# Patient Record
Sex: Female | Born: 1970 | Race: White | Hispanic: No | Marital: Married | Smoking: Never smoker
Health system: Southern US, Community
[De-identification: ages and names within clinical notes are randomized; demographics above are authoritative.]

## PROBLEM LIST (undated history)

## (undated) DIAGNOSIS — F329 Major depressive disorder, single episode, unspecified: Secondary | ICD-10-CM

## (undated) DIAGNOSIS — N83209 Unspecified ovarian cyst, unspecified side: Secondary | ICD-10-CM

## (undated) DIAGNOSIS — E0500 Thyrotoxicosis with diffuse goiter without thyrotoxic crisis or storm: Secondary | ICD-10-CM

## (undated) HISTORY — PX: TUBAL LIGATION: SHX77

## (undated) HISTORY — PX: CHOLECYSTECTOMY: SHX55

## (undated) HISTORY — DX: Major depressive disorder, single episode, unspecified: F32.9

## (undated) HISTORY — DX: Thyrotoxicosis with diffuse goiter without thyrotoxic crisis or storm: E05.00

## (undated) HISTORY — DX: Unspecified ovarian cyst, unspecified side: N83.209

## (undated) HISTORY — PX: INGUINAL HERNIA REPAIR: SUR1180

---

## 1996-08-18 HISTORY — PX: FEMUR IM ROD REMOVAL: SHX1595

## 1998-08-18 DIAGNOSIS — F32A Depression, unspecified: Secondary | ICD-10-CM

## 1998-08-18 HISTORY — DX: Depression, unspecified: F32.A

## 2006-01-18 ENCOUNTER — Emergency Department (HOSPITAL_COMMUNITY): Admission: EM | Admit: 2006-01-18 | Discharge: 2006-01-18 | Payer: Self-pay | Admitting: Emergency Medicine

## 2006-05-25 ENCOUNTER — Ambulatory Visit (HOSPITAL_COMMUNITY): Admission: RE | Admit: 2006-05-25 | Discharge: 2006-05-25 | Payer: Self-pay | Admitting: Emergency Medicine

## 2007-05-11 ENCOUNTER — Ambulatory Visit: Payer: Self-pay | Admitting: Internal Medicine

## 2007-05-11 DIAGNOSIS — E0500 Thyrotoxicosis with diffuse goiter without thyrotoxic crisis or storm: Secondary | ICD-10-CM | POA: Insufficient documentation

## 2007-05-11 DIAGNOSIS — F329 Major depressive disorder, single episode, unspecified: Secondary | ICD-10-CM | POA: Insufficient documentation

## 2007-05-13 LAB — CONVERTED CEMR LAB
Basophils Absolute: 0.1 10*3/uL (ref 0.0–0.1)
Basophils Relative: 2.5 % — ABNORMAL HIGH (ref 0.0–1.0)
CO2: 31 meq/L (ref 19–32)
Chloride: 106 meq/L (ref 96–112)
Creatinine, Ser: 0.9 mg/dL (ref 0.4–1.2)
Eosinophils Absolute: 0.1 10*3/uL (ref 0.0–0.6)
Eosinophils Relative: 2.6 % (ref 0.0–5.0)
GFR calc Af Amer: 91 mL/min
GFR calc non Af Amer: 75 mL/min
HCT: 32.7 % — ABNORMAL LOW (ref 36.0–46.0)
Monocytes Absolute: 0.3 10*3/uL (ref 0.2–0.7)
Monocytes Relative: 6.7 % (ref 3.0–11.0)
Neutro Abs: 3.2 10*3/uL (ref 1.4–7.7)
Neutrophils Relative %: 59.2 % (ref 43.0–77.0)
RBC: 3.66 M/uL — ABNORMAL LOW (ref 3.87–5.11)
RDW: 14.2 % (ref 11.5–14.6)
Sodium: 141 meq/L (ref 135–145)
TSH: 0.58 microintl units/mL (ref 0.35–5.50)

## 2007-05-25 LAB — CONVERTED CEMR LAB
HCV Ab: NEGATIVE
Hep B C IgM: NEGATIVE
Hepatitis B Surface Ag: NEGATIVE

## 2007-08-03 IMAGING — CR DG FOOT COMPLETE 3+V*R*
3 series · 3 of 3 positions shown · non-contrast
Comparison: none

CLINICAL DATA: Proximal, dorsal right foot pain and swelling after a book
dropped on her foot.

RIGHT FOOT - 3 VIEW

[t foot ap right]
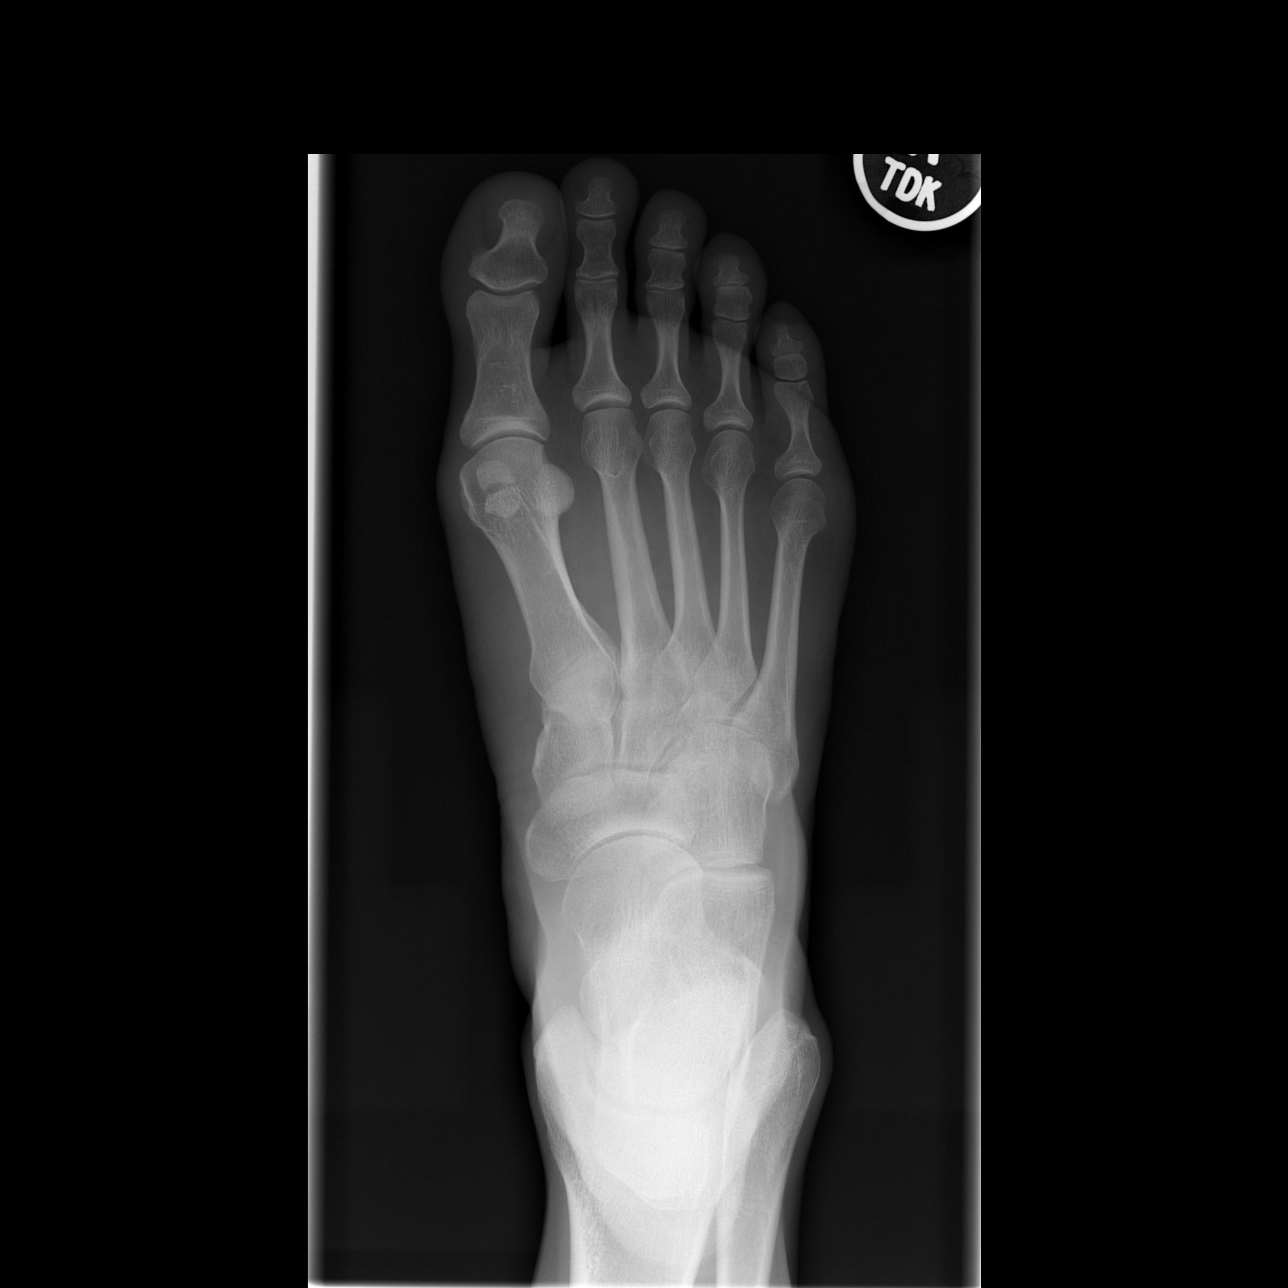

[t foot oblique right]
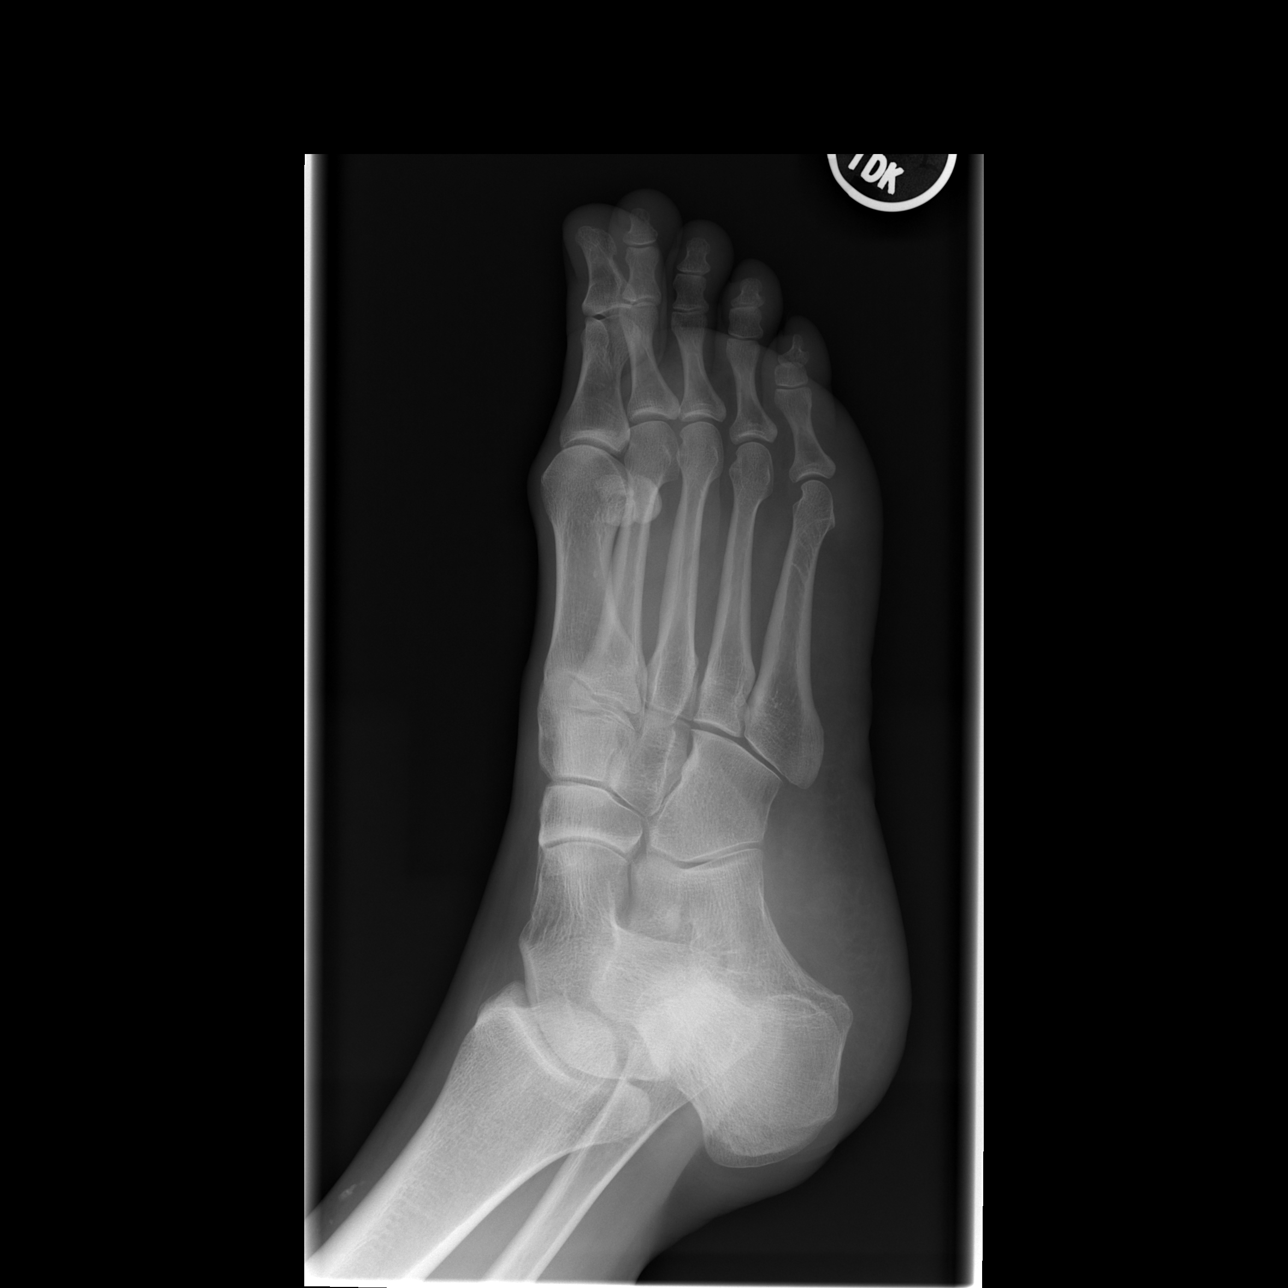

[t foot lat right]
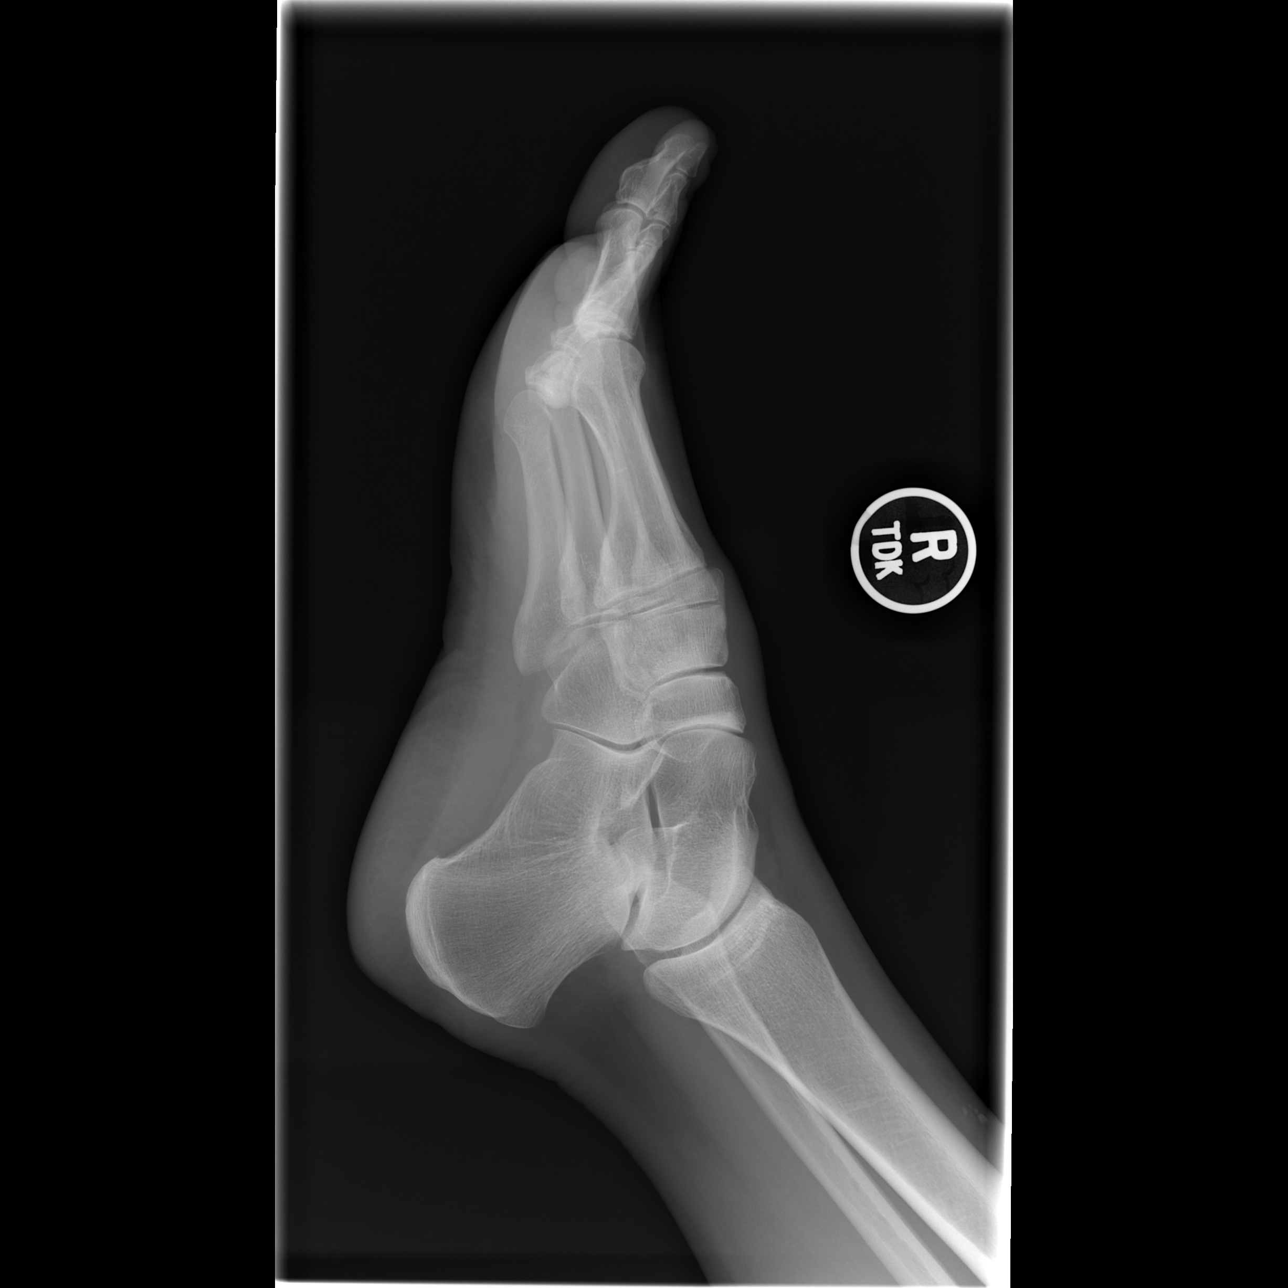

[3 of 3 positions shown; findings below may reference images not displayed]

FINDINGS: Mild proximal, dorsal soft tissue swelling. No fracture or
dislocation. Bipartite first sesamoid bone, medially.

IMPRESSION

No fracture.

## 2007-11-30 ENCOUNTER — Ambulatory Visit: Payer: Self-pay | Admitting: Family Medicine

## 2007-11-30 DIAGNOSIS — G4700 Insomnia, unspecified: Secondary | ICD-10-CM

## 2007-11-30 DIAGNOSIS — L989 Disorder of the skin and subcutaneous tissue, unspecified: Secondary | ICD-10-CM | POA: Insufficient documentation

## 2007-12-02 ENCOUNTER — Ambulatory Visit: Payer: Self-pay | Admitting: Family Medicine

## 2007-12-02 ENCOUNTER — Encounter (INDEPENDENT_AMBULATORY_CARE_PROVIDER_SITE_OTHER): Payer: Self-pay | Admitting: Internal Medicine

## 2008-04-11 ENCOUNTER — Telehealth: Payer: Self-pay | Admitting: Internal Medicine

## 2008-04-11 ENCOUNTER — Telehealth (INDEPENDENT_AMBULATORY_CARE_PROVIDER_SITE_OTHER): Payer: Self-pay | Admitting: *Deleted

## 2008-04-25 ENCOUNTER — Encounter (INDEPENDENT_AMBULATORY_CARE_PROVIDER_SITE_OTHER): Payer: Self-pay | Admitting: Internal Medicine

## 2008-04-25 ENCOUNTER — Other Ambulatory Visit: Admission: RE | Admit: 2008-04-25 | Discharge: 2008-04-25 | Payer: Self-pay | Admitting: Family Medicine

## 2008-04-25 ENCOUNTER — Ambulatory Visit: Payer: Self-pay | Admitting: Family Medicine

## 2008-04-25 DIAGNOSIS — K644 Residual hemorrhoidal skin tags: Secondary | ICD-10-CM | POA: Insufficient documentation

## 2008-04-25 DIAGNOSIS — D492 Neoplasm of unspecified behavior of bone, soft tissue, and skin: Secondary | ICD-10-CM | POA: Insufficient documentation

## 2008-04-25 LAB — CONVERTED CEMR LAB: Pap Smear: NORMAL

## 2008-04-27 ENCOUNTER — Encounter (INDEPENDENT_AMBULATORY_CARE_PROVIDER_SITE_OTHER): Payer: Self-pay | Admitting: *Deleted

## 2008-04-27 ENCOUNTER — Encounter (INDEPENDENT_AMBULATORY_CARE_PROVIDER_SITE_OTHER): Payer: Self-pay | Admitting: Internal Medicine

## 2008-04-28 LAB — CONVERTED CEMR LAB
ALT: 19 units/L (ref 0–35)
AST: 21 units/L (ref 0–37)
Basophils Absolute: 0 10*3/uL (ref 0.0–0.1)
Basophils Relative: 0 % (ref 0.0–3.0)
CO2: 28 meq/L (ref 19–32)
Cholesterol: 114 mg/dL (ref 0–200)
Creatinine, Ser: 0.8 mg/dL (ref 0.4–1.2)
Eosinophils Relative: 2.7 % (ref 0.0–5.0)
Glucose, Bld: 77 mg/dL (ref 70–99)
Hemoglobin: 11.4 g/dL — ABNORMAL LOW (ref 12.0–15.0)
Lymphocytes Relative: 39.1 % (ref 12.0–46.0)
MCV: 91.6 fL (ref 78.0–100.0)
Monocytes Relative: 6.3 % (ref 3.0–12.0)
Neutro Abs: 2.1 10*3/uL (ref 1.4–7.7)
Neutrophils Relative %: 51.9 % (ref 43.0–77.0)
Potassium: 3.9 meq/L (ref 3.5–5.1)
RBC: 3.66 M/uL — ABNORMAL LOW (ref 3.87–5.11)
Total Protein: 6.9 g/dL (ref 6.0–8.3)

## 2008-07-27 ENCOUNTER — Ambulatory Visit: Payer: Self-pay | Admitting: Family Medicine

## 2008-07-27 DIAGNOSIS — D649 Anemia, unspecified: Secondary | ICD-10-CM

## 2008-07-27 DIAGNOSIS — H6690 Otitis media, unspecified, unspecified ear: Secondary | ICD-10-CM | POA: Insufficient documentation

## 2008-08-07 ENCOUNTER — Telehealth (INDEPENDENT_AMBULATORY_CARE_PROVIDER_SITE_OTHER): Payer: Self-pay | Admitting: Internal Medicine

## 2008-08-09 LAB — CONVERTED CEMR LAB
Basophils Absolute: 0 10*3/uL (ref 0.0–0.1)
Eosinophils Absolute: 0.1 10*3/uL (ref 0.0–0.7)
HCT: 31.5 % — ABNORMAL LOW (ref 36.0–46.0)
Hemoglobin: 11.2 g/dL — ABNORMAL LOW (ref 12.0–15.0)
MCV: 93.4 fL (ref 78.0–100.0)
Monocytes Relative: 7.5 % (ref 3.0–12.0)
Neutrophils Relative %: 53 % (ref 43.0–77.0)
Platelets: 145 10*3/uL — ABNORMAL LOW (ref 150–400)

## 2008-08-25 ENCOUNTER — Telehealth (INDEPENDENT_AMBULATORY_CARE_PROVIDER_SITE_OTHER): Payer: Self-pay | Admitting: Internal Medicine

## 2009-07-24 ENCOUNTER — Telehealth (INDEPENDENT_AMBULATORY_CARE_PROVIDER_SITE_OTHER): Payer: Self-pay | Admitting: Internal Medicine

## 2009-08-21 ENCOUNTER — Ambulatory Visit: Payer: Self-pay | Admitting: Family Medicine

## 2009-08-21 DIAGNOSIS — R42 Dizziness and giddiness: Secondary | ICD-10-CM

## 2009-10-25 ENCOUNTER — Telehealth: Payer: Self-pay | Admitting: Internal Medicine

## 2009-12-19 ENCOUNTER — Encounter (INDEPENDENT_AMBULATORY_CARE_PROVIDER_SITE_OTHER): Payer: Self-pay | Admitting: *Deleted

## 2009-12-19 ENCOUNTER — Ambulatory Visit: Payer: Self-pay | Admitting: Internal Medicine

## 2009-12-19 ENCOUNTER — Ambulatory Visit: Payer: Self-pay | Admitting: Family Medicine

## 2009-12-19 DIAGNOSIS — K921 Melena: Secondary | ICD-10-CM

## 2009-12-19 DIAGNOSIS — N393 Stress incontinence (female) (male): Secondary | ICD-10-CM | POA: Insufficient documentation

## 2009-12-20 LAB — CONVERTED CEMR LAB
ALT: 22 units/L (ref 0–35)
AST: 23 units/L (ref 0–37)
BUN: 13 mg/dL (ref 6–23)
Bilirubin, Direct: 0.1 mg/dL (ref 0.0–0.3)
CO2: 31 meq/L (ref 19–32)
Calcium: 9.6 mg/dL (ref 8.4–10.5)
Chloride: 106 meq/L (ref 96–112)
Eosinophils Absolute: 0.2 10*3/uL (ref 0.0–0.7)
Eosinophils Relative: 3.5 % (ref 0.0–5.0)
Glucose, Bld: 85 mg/dL (ref 70–99)
Hemoglobin: 13.4 g/dL (ref 12.0–15.0)
Lymphs Abs: 1.6 10*3/uL (ref 0.7–4.0)
Monocytes Absolute: 0.3 10*3/uL (ref 0.1–1.0)
Neutro Abs: 2.6 10*3/uL (ref 1.4–7.7)
Potassium: 4 meq/L (ref 3.5–5.1)
Sodium: 142 meq/L (ref 135–145)
Total Bilirubin: 0.7 mg/dL (ref 0.3–1.2)
Total Protein: 7.1 g/dL (ref 6.0–8.3)
Triglycerides: 53 mg/dL (ref 0.0–149.0)
VLDL: 10.6 mg/dL (ref 0.0–40.0)
WBC: 4.7 10*3/uL (ref 4.5–10.5)

## 2009-12-27 ENCOUNTER — Encounter: Payer: Self-pay | Admitting: Family Medicine

## 2010-01-04 ENCOUNTER — Ambulatory Visit: Payer: Self-pay | Admitting: Internal Medicine

## 2010-01-07 ENCOUNTER — Encounter: Payer: Self-pay | Admitting: Internal Medicine

## 2010-01-21 ENCOUNTER — Telehealth: Payer: Self-pay | Admitting: Family Medicine

## 2010-02-25 ENCOUNTER — Telehealth: Payer: Self-pay | Admitting: Family Medicine

## 2010-09-17 NOTE — Progress Notes (Signed)
Summary: Immunization records request  Phone Note Call from Patient   Caller: Patient Summary of Call: Patient called requesting her vaccination records.  I do not see them in the chart.  Can you please check to see if we have them? Thanks, Windell Moulding MD  February 25, 2010 8:46 AM  Spoke with patient and told her what records I was able to find from [facility] and she said that she has those same records, and didn't need them.    Initial call taken by: Linde Gillis CMA [facility]),  March 15, 2010 10:12 AM

## 2010-09-17 NOTE — Progress Notes (Signed)
Summary: Neurology Referral for Vertigo- Cancelled per pt  Phone Note Outgoing Call   Summary of Call: FYI to Dr. Alphonsus Sias Neurology Referral Cancelled.   Pt was scheduled to see a Neurologist for Vertigo- says she cancelled the appt, and did not want rescheduled. Says she is better. Order completed.Daine Gip  October 25, 2009 8:52 AM    Follow-up for Phone Call        noted Follow-up by: Cindee Salt MD,  October 25, 2009 3:54 PM

## 2010-09-17 NOTE — Letter (Signed)
Summary: Patient Notice- Colon Biospy Results  [facility]  [address] [place], [place] [address]   Phone: [phone]  Fax: [phone]        Jan 07, 2010 MRN: [id number]    [name] [address] [place], [place]  [address]    Dear Ms. Raburn,  I am pleased to inform you that the biopsies taken during your recent colonoscopy did not show any evidence of colitis or proctitis, there were mild prolapse changes which are nonspecific..  Additional information/recommendations:  _x_No further action is needed at this time.  Please follow-up with      your primary care physician for your other healthcare needs.  __Please call [phone] to schedule a return visit to review      your condition.  _x_Continue with the treatment plan as outlined on the day of your      exam.You may use the suppositories intermittently for rectal bleeding.  _x_You should have a repeat colonoscopy examination for this problem           in 11_ years at age 52  Please call us if you are having persistent problems or have questions about your condition that have not been fully answered at this time.  Sincerely,  Hart Carwin MD   This letter has been electronically signed by your physician.  Appended Document: Patient Notice- Colon Biospy Results letter mailed

## 2010-09-17 NOTE — Letter (Signed)
Summary: [facility] Instructions  [facility]  [address] [place], [place] [address]   Phone: [phone]  Fax: [phone]       Evelyn Alexander    July 24, 1971    MRN: [id number]       Procedure Day Dorna Bloom:  Farrell Ours  01/04/10     Arrival Time:  10:30AM     Procedure Time:  11:30AM     Location of Procedure:                    _X _  [facility] (4th Floor)    PREPARATION FOR COLONOSCOPY WITH MIRALAX  Starting 5 days prior to your procedure 12/30/09 do not eat nuts, seeds, popcorn, corn, beans, peas,  salads, or any raw vegetables.  Do not take any fiber supplements (e.g. Metamucil, Citrucel, and Benefiber). ____________________________________________________________________________________________________   THE DAY BEFORE YOUR PROCEDURE         DATE: 01/03/10  DAY: THURSDAY  1   Drink clear liquids the entire day-NO SOLID FOOD  2   Do not drink anything colored red or purple.  Avoid juices with pulp.  No orange juice.  3   Drink at least 64 oz. (8 glasses) of fluid/clear liquids during the day to prevent dehydration and help the prep work efficiently.  CLEAR LIQUIDS INCLUDE: Water Jello Ice Popsicles Tea (sugar ok, no milk/cream) Powdered fruit flavored drinks Coffee (sugar ok, no milk/cream) Gatorade Juice: apple, white grape, white cranberry  Lemonade Clear bullion, consomm, broth Carbonated beverages (any kind) Strained chicken noodle soup Hard Candy  4   Mix the entire bottle of Miralax with 64 oz. of Gatorade/Powerade in the morning and put in the refrigerator to chill.  5   At 3:00 pm take 2 Dulcolax/Bisacodyl tablets.  6   At 4:30 pm take one Reglan/Metoclopramide tablet.  7  Starting at 5:00 pm drink one 8 oz glass of the Miralax mixture every 15-20 minutes until you have finished drinking the entire 64 oz.  You should finish drinking prep around 7:30 or 8:00 pm.  8   If you are nauseated, you may take the 2nd  Reglan/Metoclopramide tablet at 6:30 pm.        9    At 8:00 pm take 2 more DULCOLAX/Bisacodyl tablets.     THE DAY OF YOUR PROCEDURE      DATE:  01/04/10   DAY: Farrell Ours  You may drink clear liquids until 9:30AM  (2 HOURS BEFORE PROCEDURE).   MEDICATION INSTRUCTIONS  Unless otherwise instructed, you should take regular prescription medications with a small sip of water as early as possible the morning of your procedure.         OTHER INSTRUCTIONS  You will need a responsible adult at least 41 years of age to accompany you and drive you home.   This person must remain in the waiting room during your procedure.  Wear loose fitting clothing that is easily removed.  Leave jewelry and other valuables at home.  However, you may wish to bring a book to read or an iPod/MP3 player to listen to music as you wait for your procedure to start.  Remove all body piercing jewelry and leave at home.  Total time from sign-in until discharge is approximately 2-3 hours.  You should go home directly after your procedure and rest.  You can resume normal activities the day after your procedure.  The day of your procedure you should not:  Drive   Make legal decisions   Operate machinery   Drink alcohol   Return to work  You will receive specific instructions about eating, activities and medications before you leave.   The above instructions have been reviewed and explained to me by   Wyona Almas RN  Dec 19, 2009 2:00 PM     I fully understand and can verbalize these instructions _____________________________ Date _______

## 2010-09-17 NOTE — Assessment & Plan Note (Signed)
Summary: vertigo   Vital Signs:  Patient profile:   40 year old female Height:      64 inches Weight:      136.25 pounds BMI:     23.47 Temp:     98.8 degrees F oral Pulse rate:   56 / minute Pulse rhythm:   regular BP sitting:   100 / 68  (left arm) Cuff size:   regular  Vitals Entered By: Lewanda Rife LPN (August 21, 2009 10:56 AM)  CC:  Vertigo one time on 08/18/09 while on treadmill. No other episode.  History of Present Illness: Here for dizziness--onset 08/18/2009 became presyncipal running on treadmill, got off, lasted  ~45-60 sec, felt that room was spinning, able to walk holding on after few sec of sitting on treadmill, resolved completedly and has not returned --has not run again --had lots of coffee prior to run, not much to eat that day  Hx of Bells palsey remotely, has been tender in L cheek distribution of 7th cranial nerve which lasted 24 hrs some mo ago.  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  Allergies (verified): No Known Drug Allergies  Past History:  Past Medical History: Reviewed history from 05/11/2007 and no changes required. Graves disease--methimazole and PTU Ovarian cysts Depression--2000  Past Surgical History: Reviewed history from 05/11/2007 and no changes required. Left femur intramedullary rod--benign tumor  1998 Cholecystectomy--2005 Tubal ligation with chole Bilateral inguinal hernia--baby  Review of Systems CV:  Denies chest pain or discomfort and palpitations. Resp:  Denies cough, shortness of breath, and wheezing. GI:  Denies nausea and vomiting. MS:  Complains of joint pain and muscle aches. Neuro:  See HPI; Complains of disturbances in coordination and poor balance; denies brief paralysis, falling down, inability to speak, memory loss, seizures, and  weakness. Psych:  Complains of anxiety; denies depression.  Physical Exam  General:  alert, well-developed, well-nourished, and well-hydrated.  NAD Ears:  R ear normal and L ear normal.   Nose:  no mucosal edema and no airflow obstruction.   Mouth:  no exudates and pharyngeal erythema.   Neck:  no thyromegaly, no JVD, and no carotid bruits.   Lungs:  normal respiratory effort, no intercostal retractions, no accessory muscle use, and normal breath sounds.   Heart:  normal rate, regular rhythm, and no murmur.   Extremities:  no edema either lower leg Neurologic:  alert & oriented X3, cranial nerves II-XII intact, strength normal in all extremities, sensation intact to light touch, gait normal, DTRs symmetrical and normal, finger-to-nose normal, and Romberg negative--no palmer drift, able to heel-toe-tandum walk without difficulty.   Skin:  turgor normal and color normal.   Psych:  normally interactive and good eye contact.     Impression & Recommendations:  Problem # 1:  VERTIGO (ICD-780.4) Assessment New due to unusual presentation and impending trip out of the country for extended time, will refer to neurologist for eval see back if reoccurs before appt or to [place]--agrees Orders: Neurology Referral (Neuro)   In exam room  Complete Medication List: 1)  Celexa 40 Mg Tabs (Citalopram hydrobromide) .... Take 1 tablet by mouth once a day 2)  Multivitamins Tabs (Multiple vitamin) .... Take 1 tablet by mouth once a day 3)  Anusol-hc 25 Mg Supp (Hydrocortisone acetate) .Marland Kitchen.. 1 pu  to three times a day as needed discomfort 4)  Melatonin 5 Mg Caps (Melatonin) .... One at bedtime  Patient Instructions: 1)  refer to neurology  Current Allergies (reviewed today): No known allergies

## 2010-09-17 NOTE — Miscellaneous (Signed)
Summary: [facility] Previsit/prep  Clinical Lists Changes  Medications: Added new medication of DULCOLAX 5 MG  TBEC (BISACODYL) Day before procedure take 2 at 3pm and 2 at 8pm. - Signed Added new medication of METOCLOPRAMIDE HCL 10 MG  TABS (METOCLOPRAMIDE HCL) As per prep instructions. - Signed Rx of DULCOLAX 5 MG  TBEC (BISACODYL) Day before procedure take 2 at 3pm and 2 at 8pm.;  #4 x 0;  Signed;  Entered by: Wyona Almas RN;  Authorized by: Hart Carwin MD;  Method used: [facility]  [place] [place], [place]  [address], Ph: [phone] or [phone], Fax: [phone] Rx of METOCLOPRAMIDE HCL 10 MG  TABS (METOCLOPRAMIDE HCL) As per prep instructions.;  #2 x 0;  Signed;  Entered by: Wyona Almas RN;  Authorized by: Hart Carwin MD;  Method used: [facility]  [place], [place], [place]  [address], Ph: [phone] or [phone], Fax: [phone] Observations: Added new observation of NKA: T (12/19/2009 13:36)    Prescriptions: METOCLOPRAMIDE HCL 10 MG  TABS (METOCLOPRAMIDE HCL) As per prep instructions.  #2 x 0   Entered by:   Wyona Almas RN   Authorized by:   Hart Carwin MD   Signed by:   Wyona Almas RN on 12/19/2009   Method used:   [facility]ronically to        [facility]  [place] (retail)       [address]       [place], [place]  [address]       Ph: [phone] or [phone]       Fax: [phone]   RxID:   [id number] DULCOLAX 5 MG  TBEC (BISACODYL) Day before procedure take 2 at 3pm and 2 at 8pm.  #4 x 0   Entered by:   Wyona Almas RN   Authorized by:   Hart Carwin MD   Signed by:   Wyona Almas RN on 12/19/2009   Method used:   Electronically to        [facility]  [place] (retail)       [address]       [place], [place]  [address]       Ph: [phone] or [phone]       Fax: [phone]   RxID:   [id number]

## 2010-09-17 NOTE — Miscellaneous (Signed)
Summary: [facility]  Clinical Lists Changes  Medications: Added new medication of CANASA 1000 MG SUPP (MESALAMINE) insert 1 at bedtime for 2 weeks then 2 times/ week - Signed Rx of CANASA 1000 MG SUPP (MESALAMINE) insert 1 at bedtime for 2 weeks then 2 times/ week;  #30 x 1;  Signed;  Entered by: Jennye Boroughs RN;  Authorized by: Hart Carwin MD;  Method used: [facility]  [place], [place], [place]  [address], Ph: [phone] or [phone], Fax: [phone]    Prescriptions: CANASA 1000 MG SUPP (MESALAMINE) insert 1 at bedtime for 2 weeks then 2 times/ week  #30 x 1   Entered by:   Jennye Boroughs RN   Authorized by:   Hart Carwin MD   Signed by:   Jennye Boroughs RN on 01/04/2010   Method used:   Electronically to        [facility]  [place] (retail)       [address]       [place], [place]  [address]       Ph: [phone] or [phone]       Fax: [phone]   RxID:   [id number]

## 2010-09-17 NOTE — Progress Notes (Signed)
Summary: Rx Ambien  Phone Note Call from Patient   Caller: Patient Summary of Call: Received a call from pt stating that she tried to refill her Remus Loffler in [place] state but pharmacy will not accept the written script because it is out of state.  [place]aving for [place] in a few days.  Asks for [facility] to be [facility] into [facility] at [phone].  Please call in as written below.  Thanks! Initial call taken by: Ruthe Mannan MD,  January 21, 2010 9:43 AM  Follow-up for Phone Call        Tried calling in Rx to number given, number is disconnected or no longer in service.  Left a message on cell phone voicemail for patient to call back with a correct phone number to the pharmacy in [place].  Linde Gillis CMA [facility])  January 21, 2010 10:06 AM   Patient called back and gave correct phone number [phone], Rx was called in to pharmacy. Follow-up by: Linde Gillis CMA [facility]),  January 21, 2010 10:13 AM    Prescriptions: AMBIEN CR 6.25 MG  TBCR (ZOLPIDEM TARTRATE) 1 by mouth at bedtimeas needed  #30 x 6   Entered and Authorized by:   Ruthe Mannan MD   Signed by:   Ruthe Mannan MD on 01/21/2010   Method used:   Telephoned to ...       [facility]  [place] (retail)       [address]       [place], [place]  [address]       Ph: [phone] or [phone]       Fax: [phone]   RxID:   [id number]   Appended Document: Rx Ambien Received a call from the pharmacist in [place], [place] stating that [place] laws are different than [place] laws.  [place] law only allows a 5 day Rx for such medications like Ambien to be called in over the telephone.  Dr Dayton Martes, please write a Rx for a 5 day supply of Ambien with no refills and please write a Rx for a 30 day supply with refills.  These Rx's must be mailed to [address] [place] [address].  Please advise.

## 2010-09-17 NOTE — Assessment & Plan Note (Signed)
Summary: CPX/[facility]'S PT/CLE   Vital Signs:  Patient profile:   40 year old female Height:      64 inches Weight:      137.25 pounds BMI:     23.64 Temp:     98.7 degrees F oral Pulse rate:   80 / minute Pulse rhythm:   regular BP sitting:   88 / 50  (left arm) Cuff size:   regular  Vitals Entered By: Delilah Shan CMA [facility]) (Dec 19, 2009 8:42 AM) CC: CPX   History of Present Illness: Very pleasant 40 yo here for CPX.  Leaving for [place] in a few weeks, will be there for at least a year.  Stress urinary incontinence- has been ongoing issue for years, getting progressively worse. Occurs most frequently with exercise.  Has tried Kegel exercises, no improvement.  Changes in her bowel habits- has had hemorrhoids and contipation issue for years.  Most recently, has also developped diarrhea, mucous and blood in her stools with occassional fecal incontinence during exercise. No pain with defecation.  Does often have abdominal cramping.  No family h/o IBD or colon CA.  She does have a h/o Grave's disease currently in remission.  Depression- well controlled on Celexa 40 mg daily.  She is anxious about moving to [place], a lot of planning and things to think about.  No panic attacks but does have difficulty staying asleep at times.  Takes Melatonin 5 mg at bedtime which typically helps but lately is not helping much.  Current Medications (verified): 1)  Celexa 40 Mg  Tabs (Citalopram Hydrobromide) .... Take 1 Tablet By Mouth Once A Day 2)  Anusol-Hc 25 Mg Supp (Hydrocortisone Acetate) .Marland Kitchen.. 1 Pu To Three Times A Day As Needed Discomfort 3)  Melatonin 5 Mg Caps (Melatonin) .... One At Bedtime 4)  Alprazolam 0.25 Mg Tabs (Alprazolam) .Marland Kitchen.. 1 Tab By Mouth Three Times A Day As Needed Anxiety 5)  Detrol 1 Mg Tabs (Tolterodine Tartrate) .Marland Kitchen.. 1 Tab By Mouth Bid  Allergies (verified): No Known Drug Allergies  Past History:  Past Medical History: Last updated: 06-01-2007 Graves  disease--methimazole and PTU Ovarian cysts Depression--2000  Past Surgical History: Last updated: Jun 01, 2007 Left femur intramedullary rod--benign tumor  1998 Cholecystectomy--2005 Tubal ligation with chole Bilateral inguinal hernia--baby  Family History: Last updated: 06-01-2007 Dad died gliobastoma @48  Mom is bipolar, personaity disorder 2 brothers 2 adopted sibs No CAD, HTN, DM No breast or colon cancer No other depression or bipolar  Social History: Last updated: 07/27/2008 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 Eyes:  Denies blurring. ENT:  Denies difficulty swallowing. CV:  Denies chest pain or discomfort and difficulty breathing at night. Resp:  Denies shortness of breath. GI:  Complains of bloody stools, change in bowel habits, constipation, diarrhea, and hemorrhoids; denies abdominal pain, nausea, and vomiting. GU:  Complains of incontinence, urinary frequency, and urinary hesitancy; denies dysuria. MS:  Denies joint pain, joint redness, and joint swelling. Derm:  Denies rash. Psych:  Complains of anxiety; denies depression.  Physical Exam  General:  alert, well-developed, well-nourished, and well-hydrated.  NAD Head:  normocephalic, atraumatic, and no abnormalities observed.   Eyes:  pupils equal, pupils round, pupils reactive to light, and no optic disk abnormalities.   Ears:  R ear normal and L  ear normal.   Mouth:  Oral mucosa and oropharynx without lesions or exudates.  Teeth in good repair. Lungs:  normal respiratory effort, no intercostal retractions, no accessory muscle use, and normal breath sounds.   Heart:  normal rate, regular rhythm, and no murmur.   Extremities:  no edema either lower leg Skin:  turgor normal and color normal.   Psych:   normally interactive and good eye contact.     Impression & Recommendations:  Problem # 1:  Preventive Health Care (ICD-V70.0) Reviewed preventive care protocols, scheduled due services, and updated immunizations Discussed nutrition, exercise, diet, and healthy lifestyle. FLP, BMET, CBC today (h/o anemia). Orders: TLB-BMP (Basic Metabolic Panel-BMET) (80048-METABOL)  Problem # 2:  BLOOD IN STOOL (ICD-578.1) Assessment: New Did not perform rectal exam as she we were able to get her appt with GI today.  ?possible IBD, will likely need colonoscopy before she leaves for [place]. 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  UA not performed as this has been going on for years, unlikely infectious process.  Given Detrol although I explained side effects (worsening constipation).  Will most likely benefit from surgical intervention or pessary. Orders: Urology Referral (Urology)  Complete Medication List: 1)  Celexa 40 Mg Tabs (Citalopram hydrobromide) .... Take 1 tablet by mouth once a day 2)  Anusol-hc 25 Mg Supp (Hydrocortisone acetate) .Marland Kitchen.. 1 pu to three times a day as needed discomfort 3)  Melatonin 5 Mg Caps (Melatonin) .... One at bedtime 4)  Alprazolam 0.25 Mg Tabs (Alprazolam) .Marland Kitchen.. 1 tab by mouth three times a day as needed anxiety 5)  Detrol 1 Mg Tabs (Tolterodine tartrate) .Marland Kitchen.. 1 tab by mouth bid  Other Orders: Venipuncture ([address]) TLB-Lipid Panel (80061-LIPID) TLB-Hepatic/Liver Function Pnl (80076-HEPATIC) TLB-CBC Platelet - w/Differential (85025-CBCD) Tdap => 31yrs IM (91478) Admin 1st Vaccine ([address])  Patient Instructions: 1)  Great to see you!!! 2)  I will see you at your party soon. 3)  Please stop by and see Shirlee Limerick on your way out. Prescriptions: CELEXA 40 MG  TABS (CITALOPRAM HYDROBROMIDE) Take 1 tablet by mouth once a day  #90 x 12   Entered and  Authorized by:   Ruthe Mannan MD   Signed by:   Ruthe Mannan MD on 12/19/2009   Method used:   Print then Give to Patient   RxID:   [id number] DETROL 1 MG TABS (TOLTERODINE TARTRATE) 1 tab by mouth bid  #60 x 0   Entered and Authorized by:   Ruthe Mannan MD   Signed by:   Ruthe Mannan MD on 12/19/2009   Method used:   Print then Give to Patient   RxID:   [id number] ALPRAZOLAM 0.25 MG TABS (ALPRAZOLAM) 1 tab by mouth three times a day as needed anxiety  #60 x 0   Entered and Authorized by:   Ruthe Mannan MD   Signed by:   Ruthe Mannan MD on 12/19/2009   Method used:   Print then Give to Patient   RxID:   [id number]   Current Allergies (reviewed today): No known allergies    Immunizations Administered:  Tetanus Vaccine:    Vaccine Type: Tdap    Site: left deltoid    Mfr: GlaxoSmithKline    Dose: 0.5 ml    Route: IM    Given by: Delilah Shan CMA (AAMA)    Exp. Date: 01/10/2012    Lot #: [id number]  VIS given: 07/06/07 version given Dec 19, 2009.

## 2010-09-17 NOTE — Letter (Signed)
Summary: Records Dated 02-14-09 thru 09-28-09/[facility]  Records Dated 02-14-09 thru 09-28-09/[facility]   Imported By: Lanelle Bal 01/08/2010 10:40:56  _____________________________________________________________________  External Attachment:    Type:   Image     Comment:   External Document

## 2010-09-17 NOTE — Consult Note (Signed)
Summary: [facility]  [facility]   Imported By: Lanelle Bal 01/02/2010 12:41:00  _____________________________________________________________________  External Attachment:    Type:   Image     Comment:   External Document

## 2010-09-17 NOTE — Procedures (Signed)
Summary: Colonoscopy  Patient: Evelyn Alexander Note: All result statuses are Final unless otherwise noted.  Tests: (1) Colonoscopy (COL)   COL Colonoscopy           DONE     [facility]     [address]     [place], [place]  [address]           COLONOSCOPY PROCEDURE REPORT           PATIENT:  Evelyn Alexander, Evelyn Alexander  MR#:  [id number]     BIRTHDATE:  1971-01-25, 38 yrs. old  GENDER:  female     ENDOSCOPIST:  Hedwig Morton. Juanda Chance, MD     REF. BY:  Ruthe Mannan, M.D.     PROCEDURE DATE:  01/04/2010     PROCEDURE:  Colonoscopy 19147     ASA CLASS:  Class I     INDICATIONS:  hematochezia pt is a runner, painless hematochezia     and mucous x 1 year     MEDICATIONS:   Versed 10 mg, Fentanyl 100 mcg, Benadryl 25 mg           DESCRIPTION OF PROCEDURE:   After the risks benefits and     alternatives of the procedure were thoroughly explained, informed     consent was obtained.  Digital rectal exam was performed and     revealed no rectal masses.   The LB [id number] endoscope     was introduced through the anus and advanced to the cecum, which     was identified by both the appendix and ileocecal valve, without     limitations.  The quality of the prep was good, using MiraLax.     The instrument was then slowly withdrawn as the colon was fully     examined.     <<PROCEDUREIMAGES>>           FINDINGS:  Abnormal appearing mucosa. nonspecific erythema and     edema in the rectal ampulla, no friability, there is a loss of     vascular pattern, r/o proctitis With standard forceps, biopsy was     obtained and sent to pathology (see image1, image5, image6, and     image7).  This was otherwise a normal examination of the colon.     r/o microscopic IBD Random biopsies were obtained and sent to     pathology (see image7, image4, image3, and image2).   Retroflexed     views in the rectum revealed no abnormalities.    The scope was     then withdrawn from the patient and the  procedure completed.           COMPLICATIONS:  None     ENDOSCOPIC IMPRESSION:     1) Abnormal mucosa     2) Otherwise normal examination     r/o low grade proctitis, s/p biopsies     RECOMMENDATIONS:     1) Await biopsy results     Canasa supp 1000mg , # 30 insert 1 qhs x 2 weeks, then 2x/week, 1     refill     REPEAT EXAM:  In 0 year(s) for.           ______________________________     Hedwig Morton. Juanda Chance, MD           CC:           n.     eSIGNED:   Hedwig Morton. Shauntay Brunelli at 01/04/2010 12:14 PM  Fredna, Stricker, [id number]  Note: An exclamation mark (!) indicates a result that was not dispersed into the flowsheet. Document Creation Date: 01/04/2010 1:54 PM _______________________________________________________________________  (1) Order result status: Final Collection or observation date-time: 01/04/2010 12:07 Requested date-time:  Receipt date-time:  Reported date-time:  Referring Physician:   Ordering Physician: Lina Sar [phone]) Specimen Source:  Source: Launa Grill Order Number: [id number] Lab site:   Appended Document: Colonoscopy     Procedures Next Due Date:    Colonoscopy: 12/2020

## 2011-02-14 ENCOUNTER — Telehealth: Payer: Self-pay | Admitting: *Deleted

## 2011-02-14 NOTE — Telephone Encounter (Signed)
I returned her call and spoke with her.

## 2011-02-14 NOTE — Telephone Encounter (Signed)
Patient is asking if you could call her. She didn't want to give me any information, was only asking that you call her.

## 2011-02-17 ENCOUNTER — Other Ambulatory Visit: Payer: Self-pay

## 2011-02-26 ENCOUNTER — Encounter: Payer: Self-pay | Admitting: Family Medicine

## 2011-02-26 LAB — HM PAP SMEAR

## 2011-02-26 LAB — HM COLONOSCOPY

## 2011-02-27 ENCOUNTER — Ambulatory Visit (INDEPENDENT_AMBULATORY_CARE_PROVIDER_SITE_OTHER): Payer: Managed Care, Other (non HMO) | Admitting: Family Medicine

## 2011-02-27 ENCOUNTER — Encounter: Payer: Self-pay | Admitting: Family Medicine

## 2011-02-27 ENCOUNTER — Other Ambulatory Visit (HOSPITAL_COMMUNITY)
Admission: RE | Admit: 2011-02-27 | Discharge: 2011-02-27 | Disposition: A | Discharge status: Home / Self Care | Payer: Managed Care, Other (non HMO) | Source: Ambulatory Visit | Attending: Family Medicine | Admitting: Family Medicine

## 2011-02-27 VITALS — BP 100/60 | HR 65 | Temp 98.7°F | Ht 63.75 in | Wt 132.2 lb

## 2011-02-27 DIAGNOSIS — Z0000 Encounter for general adult medical examination without abnormal findings: Secondary | ICD-10-CM

## 2011-02-27 DIAGNOSIS — Z01419 Encounter for gynecological examination (general) (routine) without abnormal findings: Secondary | ICD-10-CM | POA: Insufficient documentation

## 2011-02-27 DIAGNOSIS — E0500 Thyrotoxicosis with diffuse goiter without thyrotoxic crisis or storm: Secondary | ICD-10-CM

## 2011-02-27 MED ORDER — CITALOPRAM HYDROBROMIDE 40 MG PO TABS: ORAL_TABLET | ORAL | Status: DC

## 2011-02-27 MED ORDER — ALPRAZOLAM 0.25 MG PO TABS: 0.2500 mg | ORAL_TABLET | Freq: Three times a day (TID) | ORAL | Status: DC | PRN

## 2011-02-27 MED ORDER — AZELAIC ACID 15 % EX GEL: CUTANEOUS | Status: DC

## 2011-02-27 MED ORDER — ZOLPIDEM TARTRATE ER 6.25 MG PO TBCR: 6.2500 mg | EXTENDED_RELEASE_TABLET | Freq: Every evening | ORAL | Status: DC | PRN

## 2011-02-27 MED ORDER — ATOVAQUONE-PROGUANIL HCL 250-100 MG PO TABS: 1.0000 | ORAL_TABLET | Freq: Every day | ORAL | Status: DC

## 2011-02-27 NOTE — Patient Instructions (Signed)
Great to see you!! Call me tomorrow if you have a free moment to hang out.  I will not be offended if you don't have time. [phone] We love you guys!

## 2011-02-27 NOTE — Progress Notes (Signed)
Very pleasant 40 yo here for CPX.  Home for a few weeks from [place].  Returns to [place] next week. Getting anxious about going back to finish her last year there.  Depression- well controlled on Celexa 40 mg daily.  She is anxious about going back to [place].   No panic attacks but does have difficulty staying asleep at times.  Takes Melatonin 5 mg at bedtime which typically helps, sometimes occasionally needs Ambien.  More fatigued lately with decreased exercise tolerance. No CP, SOB. Does have a h/o Grave's disease.  Denies any other symptoms of hypo or hyperthyroidism.  Patient Active Problem List  Diagnoses  . BONE TUMOR  . GRAVE'S DISEASE  . ANEMIA  . DEPRESSION  . LOM  . HEMORRHOIDS, EXTERNAL  . BLOOD IN STOOL  . URINARY INCONTINENCE, STRESS, FEMALE  . SKIN LESION  . VERTIGO  . INSOMNIA   Past Medical History  Diagnosis Date  . Grave's disease     Methimazole and PTU  . Other and unspecified ovarian cysts   . Depression 2000   Past Surgical History  Procedure Date  . Cholecystectomy   . Tubal ligation     with chole  . Inguinal hernia repair     bilateral  . Femur im rod removal 1998    benign tumor   History  Substance Use Topics  . Smoking status: Never Smoker   . Smokeless tobacco: Not on file  . Alcohol Use: Yes   Family History  Problem Relation Age of Onset  . Bipolar disorder Mother   . Personality disorder Mother    Allergies not on file Current Outpatient Prescriptions on File Prior to Visit  Medication Sig Dispense Refill  . ALPRAZolam (XANAX) 0.25 MG tablet Take 0.25 mg by mouth 3 (three) times daily as needed.        . citalopram (CELEXA) 40 MG tablet Take 40 mg by mouth daily.        Marland Kitchen doxycycline (DORYX) 100 MG DR capsule Take 100 mg by mouth daily.        . hydrocortisone (ANUSOL-HC) 25 MG suppository Place 25 mg rectally 3 (three) times daily as needed.        . Melatonin 5 MG CAPS Take 1 capsule by mouth at bedtime.        .  mesalamine (CANASA) 1000 MG suppository Two times per week       . tolterodine (DETROL) 1 MG tablet Take 1 mg by mouth 2 (two) times daily.        Marland Kitchen zolpidem (AMBIEN CR) 6.25 MG CR tablet Take 6.25 mg by mouth at bedtime as needed.         The PMH, PSH, Social History, Family History, Medications, and allergies have been reviewed in [facility], and have been updated if relevant.  Review of Systems       See HPI General:  Denies chills, fever, and malaise. Eyes:  Denies blurring. ENT:  Denies difficulty swallowing. CV:  Denies chest pain or discomfort and difficulty breathing at night. Resp:  Denies shortness of breath. MS:  Denies joint pain, joint redness, and joint swelling. Derm:  Denies rash. Psych:  Complains of anxiety; denies depression.  Physical Exam BP 100/60  Pulse 65  Temp(Src) 98.7 F (37.1 C) (Oral)  Ht 5' 3.75" (1.619 m)  Wt 132 lb 4 oz (59.988 kg)  BMI 22.88 kg/m2  LMP 02/07/2011  General:  alert, well-developed, well-nourished, and well-hydrated.  NAD Head:  normocephalic, atraumatic, and no abnormalities observed.   Eyes:  pupils equal, pupils round, pupils reactive to light, and no optic disk abnormalities.   Ears:  R ear normal and L ear normal.   Mouth:  Oral mucosa and oropharynx without lesions or exudates.  Teeth in good repair. Lungs:  normal respiratory effort, no intercostal retractions, no accessory muscle use, and normal breath sounds.   Heart:  normal rate, regular rhythm, and no murmur.   Extremities:  no edema either lower leg Skin:  turgor normal and color normal.   Psych:  normally interactive and good eye contact.    Assessment and Plan: 1. Grave's disease  TSH, T4, free  2. Routine general medical examination at a health care facility  Cytology -Pap Smear  Reviewed preventive care protocols, scheduled due services, and updated immunizations Discussed nutrition, exercise, diet, and healthy lifestyle.

## 2011-03-05 ENCOUNTER — Other Ambulatory Visit: Payer: Self-pay | Admitting: Family Medicine

## 2011-03-05 MED ORDER — ATOVAQUONE-PROGUANIL HCL 250-100 MG PO TABS: 1.0000 | ORAL_TABLET | Freq: Every day | ORAL | Status: DC

## 2011-03-10 ENCOUNTER — Telehealth: Payer: Self-pay | Admitting: *Deleted

## 2011-03-10 NOTE — Telephone Encounter (Signed)
Yes I do not plan on using lubricant from this point forward. Evelyn Alexander was advised to have it repeated when she returns from [place].

## 2011-03-10 NOTE — Telephone Encounter (Signed)
Amy called to let Dr. Dayton Martes know that the pap was repeated three times and still there was to much lubricant to successfully analyze pap results, however the HPV test was negative.  She advised against using the lubricant any longer.

## 2012-01-01 ENCOUNTER — Other Ambulatory Visit: Payer: Self-pay | Admitting: Family Medicine

## 2012-01-01 ENCOUNTER — Telehealth: Payer: Self-pay | Admitting: Family Medicine

## 2012-01-01 DIAGNOSIS — J340 Abscess, furuncle and carbuncle of nose: Secondary | ICD-10-CM

## 2012-01-01 MED ORDER — CLINDAMYCIN HCL 300 MG PO CAPS: 300.0000 mg | ORAL_CAPSULE | Freq: Three times a day (TID) | ORAL | Status: AC

## 2012-01-01 NOTE — Telephone Encounter (Signed)
Pt recently returned from 2 year stay in [place], living in [place].  Has what feels like an abscess in her nose, now having pain in ear and face on that side. Pt is a physician.  Afebrile.  Originally sent in rx for doxycycline but symptoms have progressed so we have switched to clindamycin 300 mg three times daily x 10 days.  Needs ASAP referral to ENT. Thank you.  New phone number is [phone]

## 2012-01-14 ENCOUNTER — Encounter: Payer: Self-pay | Admitting: Family Medicine

## 2012-01-14 ENCOUNTER — Other Ambulatory Visit (HOSPITAL_COMMUNITY)
Admission: RE | Admit: 2012-01-14 | Discharge: 2012-01-14 | Disposition: A | Discharge status: Home / Self Care | Payer: Managed Care, Other (non HMO) | Source: Ambulatory Visit | Attending: Family Medicine | Admitting: Family Medicine

## 2012-01-14 ENCOUNTER — Ambulatory Visit (INDEPENDENT_AMBULATORY_CARE_PROVIDER_SITE_OTHER): Payer: Managed Care, Other (non HMO) | Admitting: Family Medicine

## 2012-01-14 VITALS — BP 90/54 | HR 52 | Temp 98.2°F | Wt 135.0 lb

## 2012-01-14 DIAGNOSIS — Z01419 Encounter for gynecological examination (general) (routine) without abnormal findings: Secondary | ICD-10-CM | POA: Insufficient documentation

## 2012-01-14 DIAGNOSIS — R5383 Other fatigue: Secondary | ICD-10-CM

## 2012-01-14 DIAGNOSIS — R5381 Other malaise: Secondary | ICD-10-CM

## 2012-01-14 DIAGNOSIS — D649 Anemia, unspecified: Secondary | ICD-10-CM

## 2012-01-14 DIAGNOSIS — E0500 Thyrotoxicosis with diffuse goiter without thyrotoxic crisis or storm: Secondary | ICD-10-CM

## 2012-01-14 DIAGNOSIS — Z0000 Encounter for general adult medical examination without abnormal findings: Secondary | ICD-10-CM | POA: Insufficient documentation

## 2012-01-14 DIAGNOSIS — Z1231 Encounter for screening mammogram for malignant neoplasm of breast: Secondary | ICD-10-CM

## 2012-01-14 DIAGNOSIS — R1032 Left lower quadrant pain: Secondary | ICD-10-CM

## 2012-01-14 DIAGNOSIS — Z136 Encounter for screening for cardiovascular disorders: Secondary | ICD-10-CM

## 2012-01-14 LAB — LIPID PANEL
Cholesterol: 134 mg/dL (ref 0–200)
HDL: 44.8 mg/dL (ref >39.00)
LDL Cholesterol: 80 mg/dL (ref 0–99)
Total CHOL/HDL Ratio: 3
VLDL: 9.4 mg/dL (ref 0.0–40.0)

## 2012-01-14 LAB — COMPREHENSIVE METABOLIC PANEL
Albumin: 3.5 g/dL (ref 3.5–5.2)
Alkaline Phosphatase: 42 U/L (ref 39–117)
Chloride: 106 mEq/L (ref 96–112)
Creatinine, Ser: 0.8 mg/dL (ref 0.4–1.2)
GFR: 80.24 mL/min (ref >60.00)
Glucose, Bld: 86 mg/dL (ref 70–99)
Sodium: 139 mEq/L (ref 135–145)

## 2012-01-14 LAB — TSH: TSH: 1.04 u[IU]/mL (ref 0.35–5.50)

## 2012-01-14 LAB — CBC WITH DIFFERENTIAL/PLATELET
Basophils Relative: 0.6 % (ref 0.0–3.0)
Eosinophils Absolute: 0 10*3/uL (ref 0.0–0.7)
Eosinophils Relative: 0.2 % (ref 0.0–5.0)
Lymphs Abs: 1.9 10*3/uL (ref 0.7–4.0)
MCV: 78.7 fl (ref 78.0–100.0)
Monocytes Absolute: 0.4 10*3/uL (ref 0.1–1.0)
Monocytes Relative: 9.8 % (ref 3.0–12.0)
Neutrophils Relative %: 43 % (ref 43.0–77.0)
Platelets: 201 10*3/uL (ref 150.0–400.0)
RBC: 4.09 Mil/uL (ref 3.87–5.11)
WBC: 4 10*3/uL — ABNORMAL LOW (ref 4.5–10.5)

## 2012-01-14 LAB — T4, FREE: Free T4: 0.77 ng/dL (ref 0.60–1.60)

## 2012-01-14 MED ORDER — ZOLPIDEM TARTRATE ER 6.25 MG PO TBCR: 6.2500 mg | EXTENDED_RELEASE_TABLET | Freq: Every evening | ORAL | Status: DC | PRN

## 2012-01-14 MED ORDER — AZELAIC ACID 15 % EX GEL: CUTANEOUS | Status: DC

## 2012-01-14 MED ORDER — CITALOPRAM HYDROBROMIDE 40 MG PO TABS: ORAL_TABLET | ORAL | Status: DC

## 2012-01-14 NOTE — Patient Instructions (Signed)
Great to see you. Please stop by to see Shirlee Limerick on your way out. Good luck with your fellowship!

## 2012-01-14 NOTE — Progress Notes (Signed)
Very pleasant 41 yo here for CPX.  Just returned from 2 year stay in  [place].   Happy to be home, adjusting well. 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  Depression- well controlled on Celexa 40 mg daily.   Takes Melatonin 5 mg at bedtime which typically helps, sometimes occasionally needs Ambien.  More fatigued lately with decreased exercise tolerance. No CP, SOB. Does have a h/o Grave's disease.  Denies any other symptoms of hypo or hyperthyroidism.  Has never had a mammogram.  No family h/o breast or ovarian CA.  Left lower quadrant pain- menstrual cycle getting shorter over past few years.  Had ultrasound in [place] but was not transvaginal. Having intermittent LLQ/suprapubic pain that does seem to occur with her menstrual cycle. Mom started menopause early in mid 4s.  No dysuria, nausea or vomiting.  Pain has been intermittent over past several months.  Patient Active Problem List  Diagnoses  . BONE TUMOR  . GRAVE'S DISEASE  . ANEMIA  . DEPRESSION  . LOM  . HEMORRHOIDS, EXTERNAL  . BLOOD IN STOOL  . URINARY INCONTINENCE, STRESS, FEMALE  . SKIN LESION  . VERTIGO  . INSOMNIA  . Grave's disease  . Routine general medical examination at a health care facility  . Gynecological examination   Past Medical History  Diagnosis Date  . Grave's disease     Methimazole and PTU  . Other and unspecified ovarian cysts   . Depression 2000   Past Surgical History  Procedure Date  . Cholecystectomy   . Tubal ligation     with chole  . Inguinal hernia repair     bilateral  . Femur im rod removal 1998    benign tumor   History  Substance Use Topics  . Smoking status: Never Smoker   . Smokeless tobacco: Not on file  . Alcohol Use: Yes   Family History  Problem Relation Age of Onset  . Bipolar disorder Mother   . Personality disorder Mother    No Known Allergies Current Outpatient Prescriptions on File Prior to Visit  Medication Sig Dispense Refill  . ALPRAZolam  (XANAX) 0.25 MG tablet Take 1 tablet (0.25 mg total) by mouth 3 (three) times daily as needed.  90 tablet  0  . Melatonin 5 MG CAPS Take 1 capsule by mouth at bedtime.        Marland Kitchen DISCONTD: citalopram (CELEXA) 40 MG tablet 1 tab po daily.  [place] supply[place].  365 tablet  0  . DISCONTD: zolpidem (AMBIEN CR) 6.25 MG CR tablet Take 1 tablet (6.25 mg total) by mouth at bedtime as needed.  30 tablet  0   The PMH, PSH, Social History, Family History, Medications, and allergies have been reviewed in [facility], and have been updated if relevant.  Review of Systems       See HPI Patient reports no  vision/ hearing changes,anorexia, weight change, fever ,adenopathy, persistant / recurrent hoarseness, swallowing issues, chest pain, edema,persistant / recurrent cough, hemoptysis, dyspnea(rest, exertional, paroxysmal nocturnal), gastrointestinal  bleeding (melena, rectal bleeding), abdominal pain, excessive heart burn, GU symptoms(dysuria, hematuria, pyuria, voiding/incontinence  Issues) syncope, focal weakness, severe memory loss, concerning skin lesions, depression, anxiety, abnormal bruising/bleeding, major joint swelling.   Marland Kitchen  Physical Exam BP 90/54  Pulse 52  Temp 98.2 F (36.8 C)  Wt 135 lb (61.236 kg)  . General:  Well-developed,well-nourished,in no acute distress; alert,appropriate and cooperative throughout examination Head:  normocephalic and atraumatic.  Eyes:  vision grossly intact, pupils equal, pupils round, and pupils reactive to light.   Ears:  R ear normal and L ear normal.   Nose:  no external deformity.   Mouth:  good dentition.   Neck:  No deformities, masses, or tenderness noted. Breasts:  No mass, nodules, thickening, tenderness, bulging, retraction, inflamation, nipple discharge or skin changes noted.   Lungs:  Normal respiratory effort, chest expands symmetrically. Lungs are clear to auscultation, no crackles or wheezes. Heart:  Normal rate and regular  rhythm. S1 and S2 normal without gallop, murmur, click, rub or other extra sounds. Abdomen:  Bowel sounds positive,abdomen soft and non-tender without masses, organomegaly or hernias noted. Rectal:  no external abnormalities.   Genitalia:  Pelvic Exam:        External: normal female genitalia without lesions or masses        Vagina: normal without lesions or masses        Cervix: normal without lesions or masses        Adnexa: normal bimanual exam without masses or fullness        Uterus: normal by palpation        Pap smear: performed Msk:  No deformity or scoliosis noted of thoracic or lumbar spine.   Extremities:  No clubbing, cyanosis, edema, or deformity noted with normal full range of motion of all joints.   Neurologic:  alert & oriented X3 and gait normal.   Skin:  Intact without suspicious lesions or rashes Cervical Nodes:  No lymphadenopathy noted Axillary Nodes:  No palpable lymphadenopathy Psych:  Cognition and judgment appear intact. Alert and cooperative with normal attention span and concentration. No apparent delusions, illusions, hallucinations   Assessment and Plan: 1. Routine general medical examination at a health care facility   Reviewed preventive care protocols, scheduled due services, and updated immunizations Discussed nutrition, exercise, diet, and healthy lifestyle.  Comprehensive metabolic panel, Cytology - PAP MM Digital Screening  2. Gynecological examination  Cytology - PAP  3. Screening for ischemic heart disease  Lipid Panel  4. Left lower quadrant pain  ?possible premature ovarian failure/early menopause given changes in menstrual cycle as well. Will order transvaginal/pelvic ultrasound for further evaluation. The patient indicates understanding of these issues and agrees with the plan.  US Transvaginal Non-OB, US Pelvis Complete  5. Grave's disease  TSH, T4, Free, CBC with Differential

## 2012-01-16 ENCOUNTER — Encounter: Payer: Self-pay | Admitting: Family Medicine

## 2012-01-16 ENCOUNTER — Encounter: Payer: Self-pay | Admitting: *Deleted

## 2012-01-16 LAB — HM PAP SMEAR: HM Pap smear: NORMAL

## 2012-05-10 ENCOUNTER — Ambulatory Visit: Payer: Self-pay | Admitting: Family Medicine

## 2012-05-10 ENCOUNTER — Other Ambulatory Visit: Payer: Managed Care, Other (non HMO)

## 2012-05-10 ENCOUNTER — Telehealth: Payer: Self-pay | Admitting: Family Medicine

## 2012-05-10 DIAGNOSIS — R1032 Left lower quadrant pain: Secondary | ICD-10-CM

## 2012-05-10 NOTE — Telephone Encounter (Signed)
Received a phone call from pt stating she is still having intermittent LLQ pain- was quite severe over the weekend.  I saw her for this last May and ordered pelvic u/s.  She had to cancel appt.  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 I explained that her insurance may not pay for it, she would still like it ordered.  Will also place order for KUB- she is coming to office to have this done.

## 2012-05-10 NOTE — Telephone Encounter (Signed)
Thank you :)

## 2012-05-10 NOTE — Telephone Encounter (Signed)
Order for CA 125 written and signed by Dr. Patsy Lager, for Dr. Dayton Martes, and faxed to [phone], [facility] registration.  Patient wanted lab drawn same day and same place as ultrasound.

## 2012-05-11 ENCOUNTER — Encounter: Payer: Self-pay | Admitting: Family Medicine

## 2012-05-21 ENCOUNTER — Telehealth: Payer: Self-pay | Admitting: *Deleted

## 2012-05-25 NOTE — Telephone Encounter (Signed)
na

## 2012-06-03 ENCOUNTER — Other Ambulatory Visit: Payer: Self-pay | Admitting: *Deleted

## 2012-06-03 MED ORDER — ZOLPIDEM TARTRATE 10 MG PO TABS: 10.0000 mg | ORAL_TABLET | Freq: Every evening | ORAL | Status: DC | PRN

## 2012-06-03 MED ORDER — CITALOPRAM HYDROBROMIDE 40 MG PO TABS: ORAL_TABLET | ORAL | Status: DC

## 2012-06-03 MED ORDER — ZOLPIDEM TARTRATE ER 6.25 MG PO TBCR: 6.2500 mg | EXTENDED_RELEASE_TABLET | Freq: Every evening | ORAL | Status: DC | PRN

## 2012-06-03 NOTE — Telephone Encounter (Addendum)
Received voicemail from pt requesting Ambien be increased from "5 mg to 10 mg" and also needs refill of citalopram. She uses [facility]. I tried to call pt back to verify that pharmacy since we do not have it in her record, but received a recording that her voicemail is not set up.   Pt returned call and gave updated pharmacy info [facility] [phone]), I changed in epic. She states she wanted increase to 10 mg b/c she had to take 2 of the 5 mg tabs to get relief.

## 2012-06-03 NOTE — Telephone Encounter (Signed)
Ok to increase to 10 mg daily dose- I sent rx request back to [place].

## 2012-06-03 NOTE — Telephone Encounter (Signed)
Ambien called to pharmacy. 

## 2012-08-26 ENCOUNTER — Other Ambulatory Visit: Payer: Self-pay

## 2012-08-26 MED ORDER — ZOLPIDEM TARTRATE 10 MG PO TABS: 10.0000 mg | ORAL_TABLET | Freq: Every evening | ORAL | Status: DC | PRN

## 2012-08-26 NOTE — Telephone Encounter (Signed)
Medicine called to [facility]. 

## 2012-08-26 NOTE — Telephone Encounter (Signed)
Pt left v/m requesting zolpidem to [facility].Please advise.

## 2013-01-13 ENCOUNTER — Other Ambulatory Visit: Payer: Self-pay | Admitting: *Deleted

## 2013-01-13 MED ORDER — CITALOPRAM HYDROBROMIDE 40 MG PO TABS: ORAL_TABLET | ORAL | Status: DC

## 2013-05-24 ENCOUNTER — Other Ambulatory Visit: Payer: Self-pay | Admitting: *Deleted

## 2013-05-24 ENCOUNTER — Encounter: Payer: Self-pay | Admitting: Family Medicine

## 2013-05-24 ENCOUNTER — Ambulatory Visit (INDEPENDENT_AMBULATORY_CARE_PROVIDER_SITE_OTHER): Payer: BC Managed Care – PPO | Admitting: Family Medicine

## 2013-05-24 VITALS — BP 118/62 | HR 48 | Temp 98.0°F | Wt 136.5 lb

## 2013-05-24 DIAGNOSIS — G4700 Insomnia, unspecified: Secondary | ICD-10-CM | POA: Insufficient documentation

## 2013-05-24 DIAGNOSIS — N951 Menopausal and female climacteric states: Secondary | ICD-10-CM

## 2013-05-24 DIAGNOSIS — Z1231 Encounter for screening mammogram for malignant neoplasm of breast: Secondary | ICD-10-CM

## 2013-05-24 MED ORDER — ALPRAZOLAM 0.25 MG PO TABS: 0.2500 mg | ORAL_TABLET | Freq: Three times a day (TID) | ORAL | Status: DC | PRN

## 2013-05-24 MED ORDER — ZOLPIDEM TARTRATE 10 MG PO TABS: 10.0000 mg | ORAL_TABLET | Freq: Every evening | ORAL | Status: DC | PRN

## 2013-05-24 MED ORDER — NORETHIN ACE-ETH ESTRAD-FE 1-20 MG-MCG PO TABS: 1.0000 | ORAL_TABLET | Freq: Every day | ORAL | Status: DC

## 2013-05-24 NOTE — Progress Notes (Signed)
Subjective:    Patient ID: Evelyn Alexander M.D. [place], female    DOB: 1971-02-10, 42 y.o.   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  She is going through an ED fellowship right now but does not think her call schedule or work schedule in general is the source of her worsening symptoms.  Takes melatonin every night and can fall asleep.  Wakes up in the middle of the night- 5 out of 7 nights and cannot get back to sleep.  If she takes an [place] in the middle of the night, she is too groggy in the morning and has to call out of work. Snapping at her husband and children and their family relationships are typically very good.  Has been taking Celexa 40 mg daily for over 10 years and has felt it very effective.  Having two periods a month for past several months- around the same time her insomnia worsened. Does have hot flashes and does have periods of anxiety and depression  No current SI or HI.  Patient Active Problem List   Diagnosis Date Noted  . Insomnia 05/24/2013  . Perimenopause 05/24/2013  . Routine general medical examination at a health care facility 01/14/2012  . Grave's disease 02/27/2011  . BLOOD IN STOOL 12/19/2009  . URINARY INCONTINENCE, STRESS, FEMALE 12/19/2009  . VERTIGO 08/21/2009  . ANEMIA 07/27/2008  . LOM 07/27/2008  . 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 HEMORRHOIDS, EXTERNAL 04/25/2008  . SKIN LESION 11/30/2007  . INSOMNIA 11/30/2007  . GRAVE'S DISEASE 05/11/2007  . DEPRESSION 05/11/2007   Past Medical History  Diagnosis Date  . Grave's disease     Methimazole and PTU  . Other and unspecified ovarian cysts   . Depression 2000   Past Surgical History  Procedure Laterality Date  . Cholecystectomy    . Tubal ligation      with chole  . Inguinal hernia repair      bilateral  . Femur im rod removal  1998    benign tumor   History  Substance Use Topics  . Smoking status: Never Smoker   . Smokeless  tobacco: Not on file  . Alcohol Use: Yes   Family History  Problem Relation Age of Onset  . Bipolar disorder Mother   . Personality disorder Mother    No Known Allergies Current Outpatient Prescriptions on File Prior to Visit  Medication Sig Dispense Refill  . citalopram (CELEXA) 40 MG tablet 1 tab po daily.  90 tablet  0  . Melatonin 5 MG CAPS Take 1 capsule by mouth at bedtime.         No current facility-administered medications on file prior to visit.   The PMH, PSH, Social History, Family History, Medications, and allergies have been reviewed in [facility], and have been updated if relevant.    Review of Systems See HPI    Objective:   Physical Exam BP 118/62  Pulse 48  Temp(Src) 98 F (36.7 C) (Oral)  Wt 136 lb 8 oz (61.916 kg)  BMI 23.62 kg/m2  SpO2 97%  LMP 05/05/2013  General:  Well-developed,well-nourished,in no acute distress; alert,appropriate and cooperative throughout examination Head:  normocephalic and atraumatic.   Psych:  Cognition and judgment appear intact. Alert and cooperative with normal attention span and concentration. No apparent delusions, illusions, hallucinations     Assessment & Plan:  1. Other screening mammogram  - MM Digital Screening; Future  2. Insomnia >25  min spent with face to face with patient, >50% counseling and/or coordinating care Deteriorated and based on other symptoms, I suspect this may be due to perimenopause, at least in part.  See below.  3. Perimenopause Will start OCP- advised for first month or two to try to use as directed.  If symptoms return during placebo period, she can use continuous OCPs. Continue current dose of celexa. Follow up in 1 month.  If symptoms not improving, consider changing SSRI.

## 2013-05-24 NOTE — Patient Instructions (Addendum)
Great to see you. Tell your mom I said Happy Birthday! Call me in a month or two with an update- I would take continuous pill packs if you have issues with recurrence of symptoms during the "placebo" period.  We will call you with your mammogram appointment.

## 2013-06-15 ENCOUNTER — Telehealth: Payer: Self-pay

## 2013-06-15 ENCOUNTER — Other Ambulatory Visit: Payer: Self-pay | Admitting: Internal Medicine

## 2013-06-15 DIAGNOSIS — N926 Irregular menstruation, unspecified: Secondary | ICD-10-CM

## 2013-06-15 DIAGNOSIS — F329 Major depressive disorder, single episode, unspecified: Secondary | ICD-10-CM

## 2013-06-15 MED ORDER — CITALOPRAM HYDROBROMIDE 40 MG PO TABS: ORAL_TABLET | ORAL | Status: DC

## 2013-06-15 MED ORDER — NORETHINDRONE-ETH ESTRADIOL 1-35 MG-MCG PO TABS: 1.0000 | ORAL_TABLET | Freq: Every day | ORAL | Status: DC

## 2013-06-15 NOTE — Telephone Encounter (Signed)
Pt left v/m that pt is having severe depression and request citalopram to [facility]. Pt is out of med today. Pt said pt was seen 05/24/13 and was advised by Dr Dayton Martes could take continuous pill packs if recurrence of symptoms during placebo period. Pt said pharmacy does not want pt to do that with 21 day pack and pt request a 28 day BC pill sent to [facility].

## 2013-06-15 NOTE — Telephone Encounter (Signed)
Patient notified as instructed by telephone. 

## 2013-06-15 NOTE — Telephone Encounter (Signed)
Left v/m for pt to cb. 

## 2013-06-15 NOTE — Telephone Encounter (Signed)
RX done and sent to pharmacy

## 2013-07-25 ENCOUNTER — Other Ambulatory Visit: Payer: Self-pay | Admitting: *Deleted

## 2013-07-25 DIAGNOSIS — N926 Irregular menstruation, unspecified: Secondary | ICD-10-CM

## 2013-07-25 MED ORDER — NORETHINDRONE-ETH ESTRADIOL 1-35 MG-MCG PO TABS: 1.0000 | ORAL_TABLET | Freq: Every day | ORAL | Status: DC

## 2013-08-09 ENCOUNTER — Other Ambulatory Visit: Payer: Self-pay

## 2013-08-09 MED ORDER — ALPRAZOLAM 0.25 MG PO TABS: 0.2500 mg | ORAL_TABLET | Freq: Three times a day (TID) | ORAL | Status: DC | PRN

## 2013-08-09 NOTE — Telephone Encounter (Signed)
Rx called in to pharmacy. 

## 2013-08-09 NOTE — Telephone Encounter (Signed)
Ok to phone in xanax 

## 2013-08-09 NOTE — Telephone Encounter (Signed)
Pt left v/m requesting refill alprazolam to [facility] [place], pt request ccb when phoned in.Please advise.

## 2013-08-23 ENCOUNTER — Other Ambulatory Visit: Payer: Self-pay | Admitting: Internal Medicine

## 2013-08-23 DIAGNOSIS — F32A Depression, unspecified: Secondary | ICD-10-CM

## 2013-08-23 DIAGNOSIS — F329 Major depressive disorder, single episode, unspecified: Secondary | ICD-10-CM

## 2013-08-24 MED ORDER — CITALOPRAM HYDROBROMIDE 40 MG PO TABS: ORAL_TABLET | ORAL | Status: DC

## 2013-08-24 NOTE — Telephone Encounter (Signed)
See message below - please advise

## 2013-08-24 NOTE — Addendum Note (Signed)
Addended by: Lucille Passy on: 08/24/2013 12:28 PM   Modules accepted: Orders

## 2013-08-24 NOTE — Telephone Encounter (Signed)
Dr. Deborra Medina pt

## 2013-08-24 NOTE — Telephone Encounter (Signed)
Last Rx from 06/15/13 #90--please advise--seems to soon?

## 2013-10-10 ENCOUNTER — Other Ambulatory Visit: Payer: Self-pay | Admitting: Internal Medicine

## 2013-10-11 NOTE — Telephone Encounter (Signed)
Ok to phone in xanax 

## 2013-10-11 NOTE — Telephone Encounter (Signed)
Last filled 08/09/13--please advise 

## 2013-10-11 NOTE — Telephone Encounter (Signed)
Rx called in to pharmacy. 

## 2013-10-12 NOTE — Telephone Encounter (Signed)
Pt left v/m requesting status of alprazolam refill; Horris Latino at [facility] said ready for pick up. Pt notified.

## 2013-11-23 IMAGING — US US PELV - US TRANSVAGINAL
1 series · 14 of 25 positions shown · non-contrast
Comparison: none

REASON FOR EXAM: LLQ pain
COMMENTS:

[Series 1: us pelv - us transvaginal · 0.23mm/px · 14 of 60 slices shown]
[im 1/60]
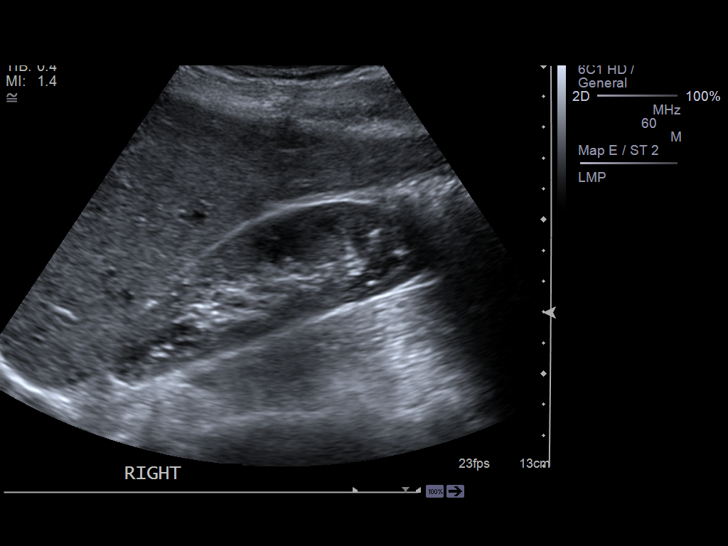
[im 5/60]
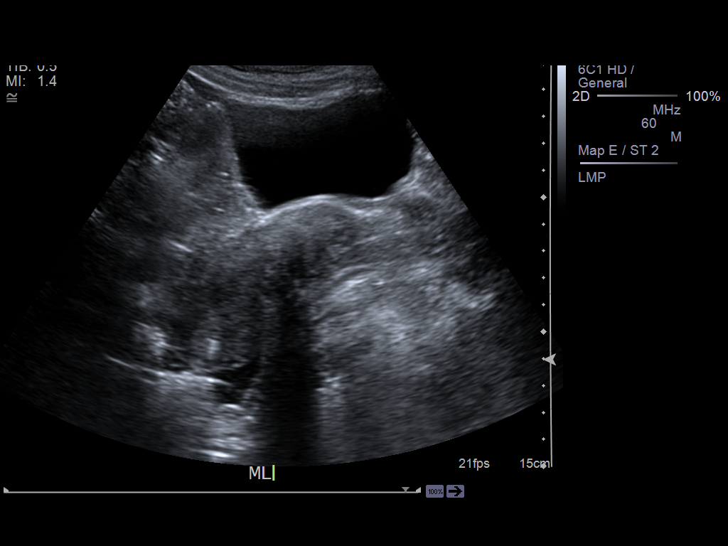
[im 10/60]
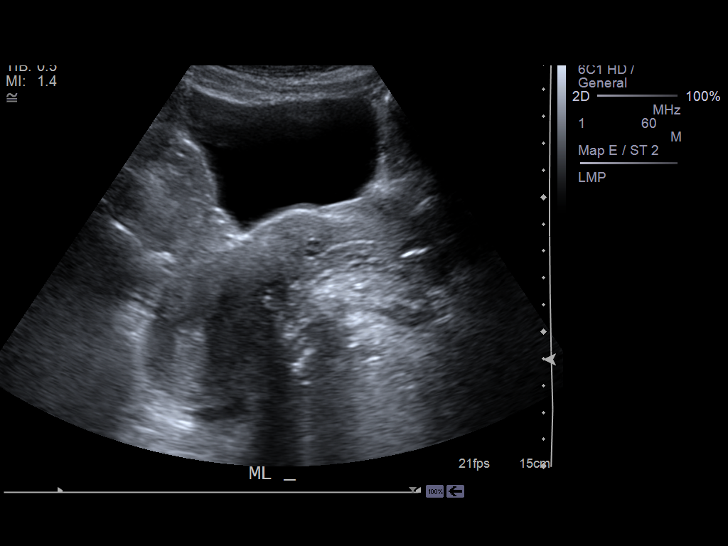
[im 15/60]
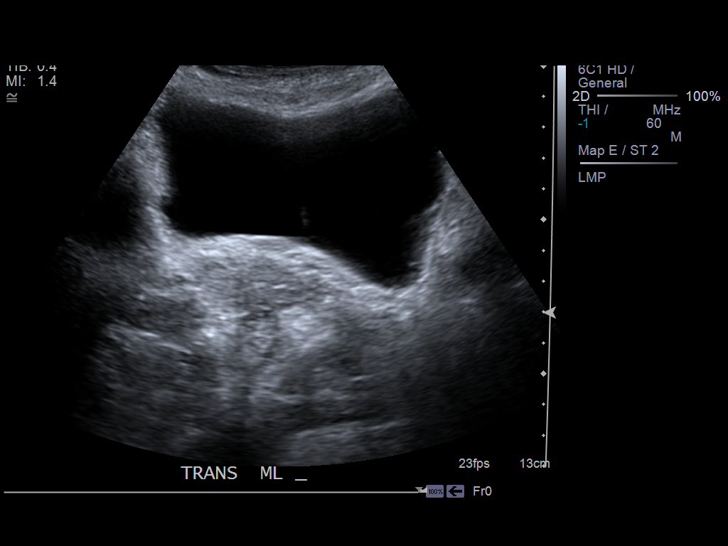
[im 20/60]
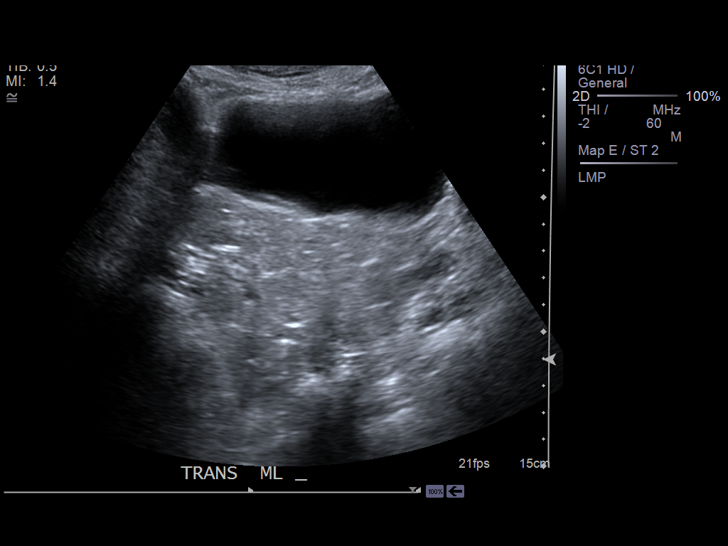
[im 23/60]
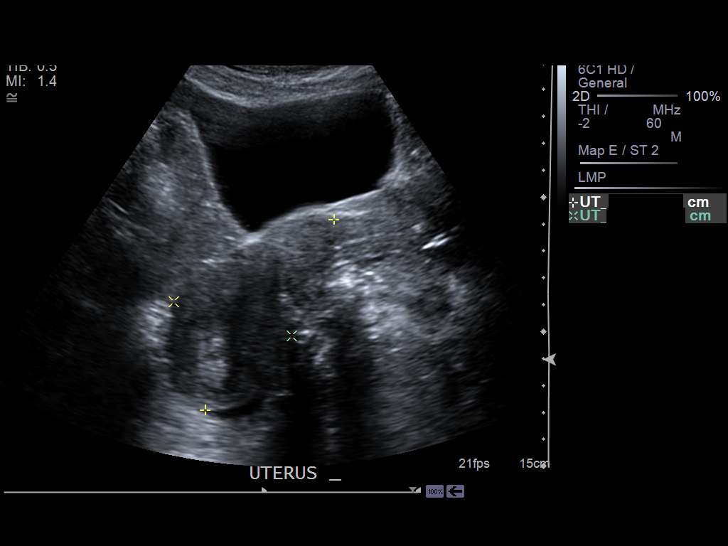
[im 28/60]
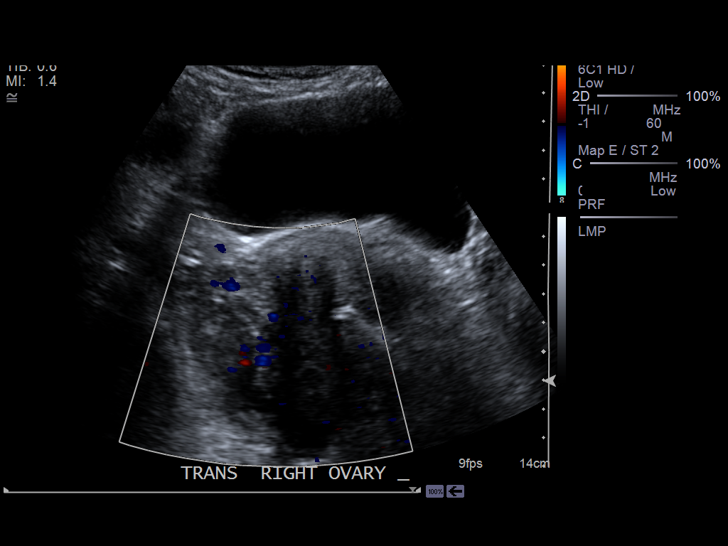
[im 32/60]
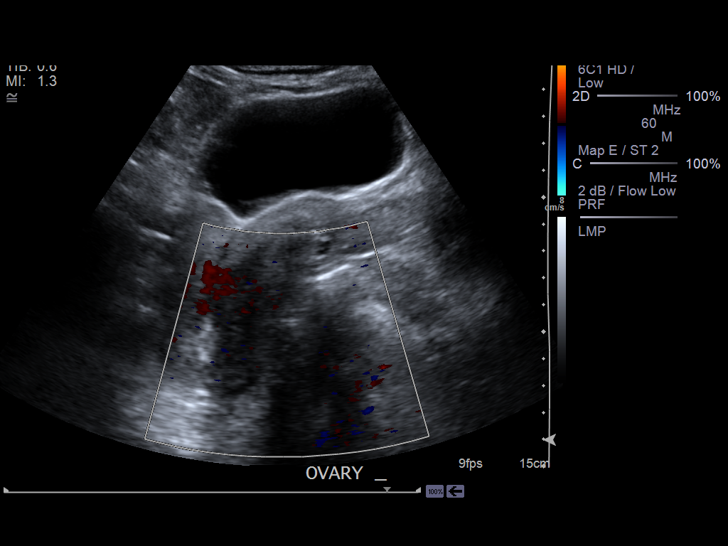
[im 37/60]
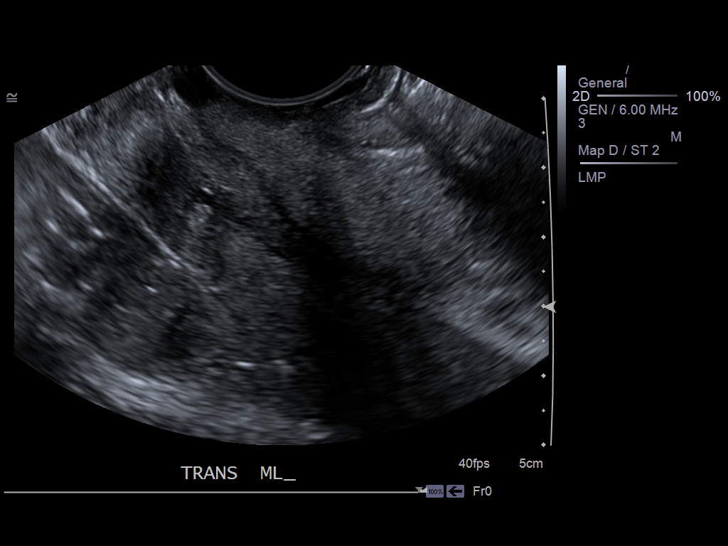
[im 40/60]
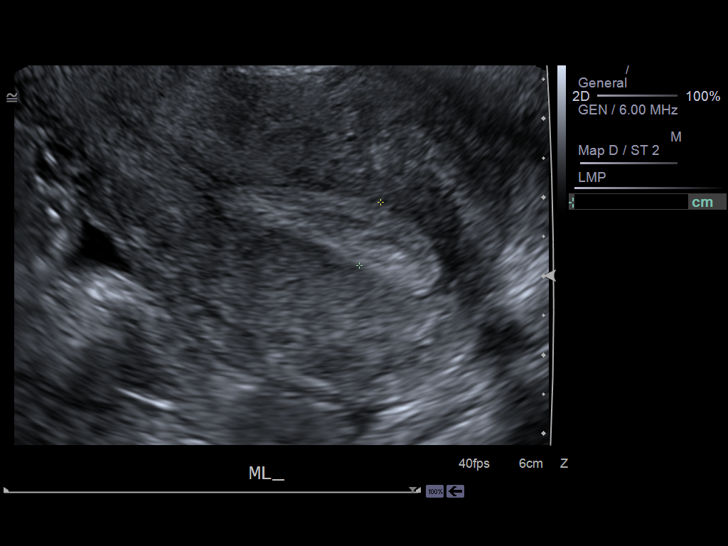
[im 45/60]
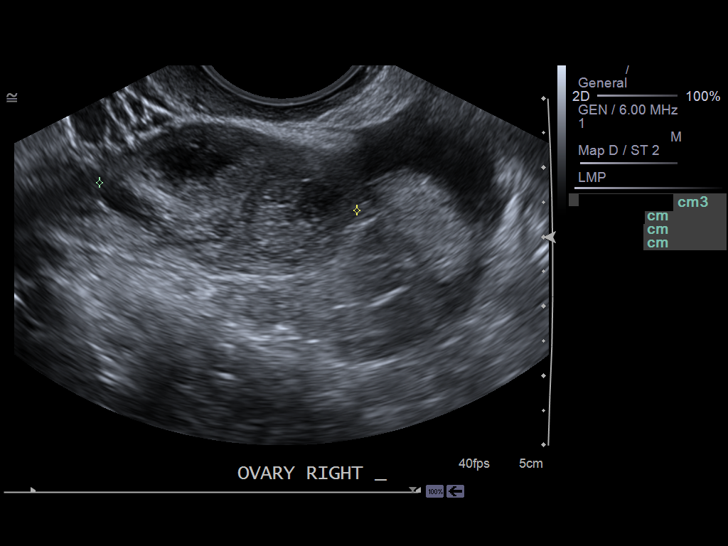
[im 50/60]
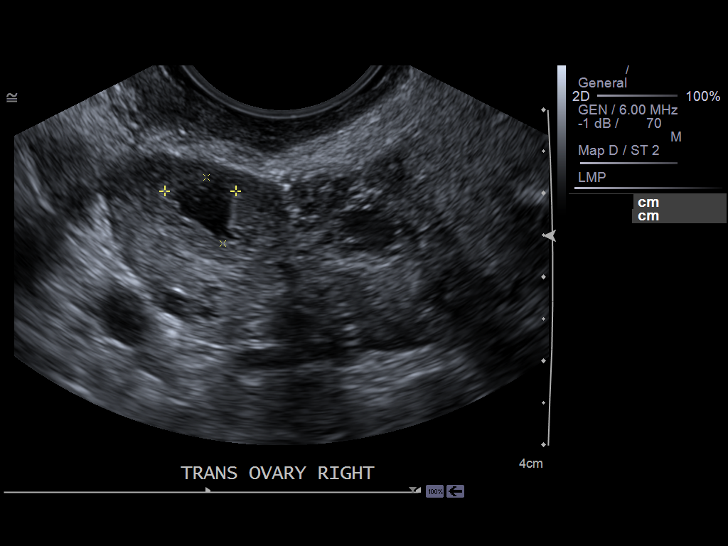
[im 55/60]
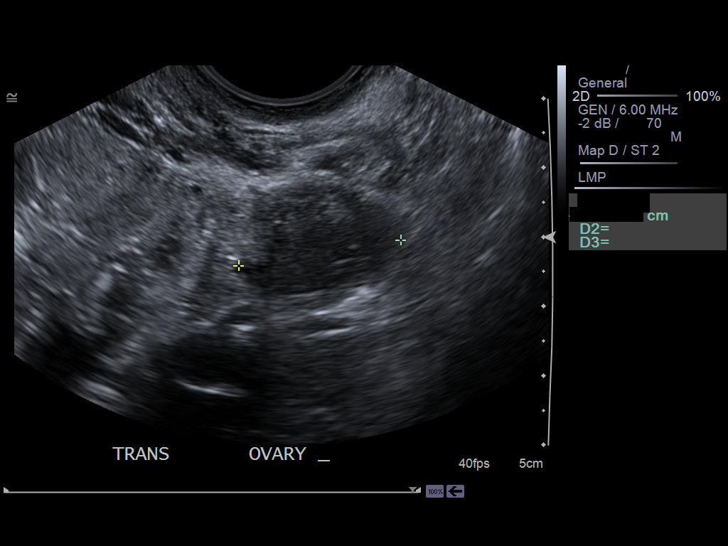
[im 60/60]
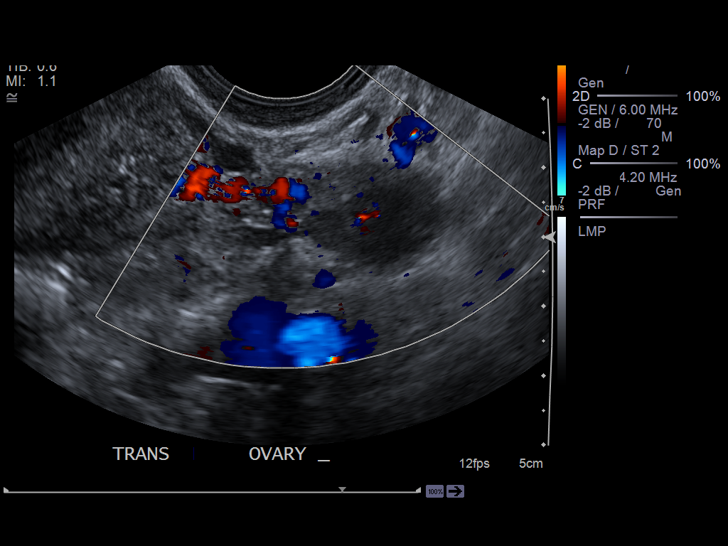

[14 of 25 positions shown; findings below may reference images not displayed]

PROCEDURE:     US  - US PELVIS EXAM W/TRANSVAGINAL  - May 10, 2012  [DATE]

RESULT:     The patient is reporting left lower quadrant discomfort.

Transabdominal and transvaginal imaging was performed. The uterus exhibits
normal echotexture and measures 8.5 x 6 x 4.8 cm. It is retro- flexed. There
is free fluid in the cul-de-sac. The endometrial stripe measures 8.5 mm in
thickness. There are nabothian cysts in the cervix.

The right ovary measures 2.4 x 2 x 3.7 cm and contains 2 cysts. One measures
1.2 cm in greatest dimension while the other measures 1.4 cm in greatest
dimension. The left ovary measures 2.4 x 2.6 x 1.4 cm. Survey views of the
kidneys are normal in appearance.
IMPRESSION: 1. The uterus is normal in appearance.
2. There is free fluid in the pelvis.
3. There are 2 cysts associated with the right ovary. The left adnexal
region appears normal.

[REDACTED]

## 2013-11-29 ENCOUNTER — Other Ambulatory Visit: Payer: Self-pay | Admitting: Internal Medicine

## 2013-11-30 NOTE — Telephone Encounter (Signed)
Lm on pts vm informing her Rx has been called in to requested pharmacy 

## 2014-01-11 ENCOUNTER — Other Ambulatory Visit: Payer: Self-pay | Admitting: *Deleted

## 2014-01-11 MED ORDER — ZOLPIDEM TARTRATE 10 MG PO TABS: 10.0000 mg | ORAL_TABLET | Freq: Every evening | ORAL | Status: DC | PRN

## 2014-01-11 MED ORDER — ALPRAZOLAM 0.25 MG PO TABS: ORAL_TABLET | ORAL | Status: DC

## 2014-01-11 NOTE — Telephone Encounter (Signed)
Pt requesting medication refill. Last ov 05/2013-acute with no future appts scheduled. pls advise

## 2014-01-11 NOTE — Telephone Encounter (Signed)
Spoke to pt and informed her Rx has been called in to requested pharmacy; f/u appt sched

## 2014-02-28 ENCOUNTER — Ambulatory Visit: Payer: BC Managed Care – PPO | Admitting: Family Medicine

## 2014-03-13 ENCOUNTER — Ambulatory Visit: Payer: BC Managed Care – PPO | Admitting: Family Medicine

## 2014-03-22 ENCOUNTER — Encounter: Payer: BC Managed Care – PPO | Admitting: Family Medicine

## 2014-09-25 ENCOUNTER — Telehealth: Payer: Self-pay | Admitting: Family Medicine

## 2014-09-25 DIAGNOSIS — F329 Major depressive disorder, single episode, unspecified: Secondary | ICD-10-CM

## 2014-09-25 DIAGNOSIS — F32A Depression, unspecified: Secondary | ICD-10-CM

## 2014-09-25 DIAGNOSIS — N926 Irregular menstruation, unspecified: Secondary | ICD-10-CM

## 2014-09-25 MED ORDER — CITALOPRAM HYDROBROMIDE 40 MG PO TABS
ORAL_TABLET | ORAL | Status: DC
Start: 2014-09-25 — End: 2015-08-09

## 2014-09-25 MED ORDER — NORETHINDRONE-ETH ESTRADIOL 1-35 MG-MCG PO TABS: 1.0000 | ORAL_TABLET | Freq: Every day | ORAL | Status: DC

## 2014-09-25 NOTE — Telephone Encounter (Signed)
Pt called stating pharmacy has been sending refill requests for her OCPs and celexa.  I have not received any requests. Will send in eRxs now.

## 2014-10-30 ENCOUNTER — Other Ambulatory Visit: Payer: Self-pay

## 2014-10-30 MED ORDER — ALPRAZOLAM 0.25 MG PO TABS: ORAL_TABLET | ORAL | Status: DC

## 2014-10-30 MED ORDER — ZOLPIDEM TARTRATE 10 MG PO TABS: 10.0000 mg | ORAL_TABLET | Freq: Every evening | ORAL | Status: DC | PRN

## 2014-10-30 NOTE — Telephone Encounter (Signed)
Rx called in to requested pharmacy 

## 2014-10-30 NOTE — Telephone Encounter (Signed)
Pt left v/m; pt is working nights and request refill alprazolam and zolpidem to [facility] [address] Pt has not needed in about 1 year. Pt request cb. Pt last seen 05/24/2013 and rxs last printed 01/11/14.

## 2015-02-12 ENCOUNTER — Telehealth: Payer: Self-pay | Admitting: *Deleted

## 2015-02-12 MED ORDER — ZOLPIDEM TARTRATE 10 MG PO TABS: 10.0000 mg | ORAL_TABLET | Freq: Every evening | ORAL | Status: DC | PRN

## 2015-02-12 MED ORDER — ALPRAZOLAM 0.25 MG PO TABS: ORAL_TABLET | ORAL | Status: DC

## 2015-02-12 NOTE — Telephone Encounter (Signed)
Ok to refill 

## 2015-02-12 NOTE — Telephone Encounter (Signed)
Pharmacy line busy.

## 2015-02-12 NOTE — Telephone Encounter (Signed)
Rx faxed to requested pharmacy. Pt advised that per Dr Deborra Medina, an OV will be required for any additional refills

## 2015-02-13 NOTE — Telephone Encounter (Signed)
Pt spoke with Areille at front desk and said [facility] did not get refill request for alprazolam and zolpidem; spoke with [facility] and verified did not get refills. Request to resend rx. Pt request cb when done.

## 2015-02-13 NOTE — Telephone Encounter (Signed)
Order refaxed as requested.

## 2015-02-28 ENCOUNTER — Encounter: Payer: Self-pay | Admitting: Internal Medicine

## 2015-04-11 ENCOUNTER — Encounter: Payer: Self-pay | Admitting: Family Medicine

## 2015-04-25 ENCOUNTER — Encounter: Payer: Self-pay | Admitting: Family Medicine

## 2015-04-25 ENCOUNTER — Ambulatory Visit (INDEPENDENT_AMBULATORY_CARE_PROVIDER_SITE_OTHER): Payer: Self-pay | Admitting: Family Medicine

## 2015-04-25 ENCOUNTER — Other Ambulatory Visit (HOSPITAL_COMMUNITY)
Admission: RE | Admit: 2015-04-25 | Discharge: 2015-04-25 | Disposition: A | Discharge status: Home / Self Care | Payer: BC Managed Care – PPO | Source: Ambulatory Visit | Attending: Family Medicine | Admitting: Family Medicine

## 2015-04-25 VITALS — BP 102/64 | HR 45 | Temp 98.0°F | Ht 64.25 in | Wt 132.8 lb

## 2015-04-25 DIAGNOSIS — Z01419 Encounter for gynecological examination (general) (routine) without abnormal findings: Secondary | ICD-10-CM

## 2015-04-25 DIAGNOSIS — F329 Major depressive disorder, single episode, unspecified: Secondary | ICD-10-CM

## 2015-04-25 DIAGNOSIS — G4700 Insomnia, unspecified: Secondary | ICD-10-CM

## 2015-04-25 DIAGNOSIS — Z1151 Encounter for screening for human papillomavirus (HPV): Secondary | ICD-10-CM | POA: Diagnosis not present

## 2015-04-25 DIAGNOSIS — Z0000 Encounter for general adult medical examination without abnormal findings: Secondary | ICD-10-CM

## 2015-04-25 DIAGNOSIS — F32A Depression, unspecified: Secondary | ICD-10-CM

## 2015-04-25 DIAGNOSIS — Z1239 Encounter for other screening for malignant neoplasm of breast: Secondary | ICD-10-CM

## 2015-04-25 DIAGNOSIS — Z23 Encounter for immunization: Secondary | ICD-10-CM

## 2015-04-25 LAB — LIPID PANEL
CHOL/HDL RATIO: 5
Cholesterol: 162 mg/dL (ref 0–200)
HDL: 32.4 mg/dL — ABNORMAL LOW (ref >39.00)
LDL Cholesterol: 112 mg/dL — ABNORMAL HIGH (ref 0–99)
NONHDL: 129.92
Triglycerides: 90 mg/dL (ref 0.0–149.0)
VLDL: 18 mg/dL (ref 0.0–40.0)

## 2015-04-25 LAB — CBC WITH DIFFERENTIAL/PLATELET
Basophils Absolute: 0 10*3/uL (ref 0.0–0.1)
Basophils Relative: 0.4 % (ref 0.0–3.0)
Eosinophils Absolute: 0.1 10*3/uL (ref 0.0–0.7)
Eosinophils Relative: 3 % (ref 0.0–5.0)
HCT: 39.7 % (ref 36.0–46.0)
Hemoglobin: 13.4 g/dL (ref 12.0–15.0)
LYMPHS ABS: 1.4 10*3/uL (ref 0.7–4.0)
Lymphocytes Relative: 33.2 % (ref 12.0–46.0)
MCHC: 33.7 g/dL (ref 30.0–36.0)
MCV: 94.5 fl (ref 78.0–100.0)
Monocytes Absolute: 0.4 10*3/uL (ref 0.1–1.0)
Monocytes Relative: 8.7 % (ref 3.0–12.0)
NEUTROS ABS: 2.2 10*3/uL (ref 1.4–7.7)
NEUTROS PCT: 54.7 % (ref 43.0–77.0)
PLATELETS: 204 10*3/uL (ref 150.0–400.0)
RBC: 4.2 Mil/uL (ref 3.87–5.11)
RDW: 13.4 % (ref 11.5–15.5)
WBC: 4.1 10*3/uL (ref 4.0–10.5)

## 2015-04-25 LAB — COMPREHENSIVE METABOLIC PANEL
ALK PHOS: 39 U/L (ref 39–117)
ALT: 26 U/L (ref 0–35)
AST: 26 U/L (ref 0–37)
Albumin: 3.7 g/dL (ref 3.5–5.2)
BILIRUBIN TOTAL: 0.6 mg/dL (ref 0.2–1.2)
BUN: 12 mg/dL (ref 6–23)
CO2: 28 meq/L (ref 19–32)
Calcium: 8.8 mg/dL (ref 8.4–10.5)
Chloride: 107 mEq/L (ref 96–112)
Creatinine, Ser: 0.91 mg/dL (ref 0.40–1.20)
GFR: 71.05 mL/min (ref >60.00)
Glucose, Bld: 76 mg/dL (ref 70–99)
Potassium: 3.8 mEq/L (ref 3.5–5.1)
SODIUM: 141 meq/L (ref 135–145)
Total Protein: 6.9 g/dL (ref 6.0–8.3)

## 2015-04-25 LAB — VITAMIN B12: VITAMIN B 12: 182 pg/mL — AB (ref 211–911)

## 2015-04-25 LAB — TSH: TSH: 0.99 u[IU]/mL (ref 0.35–4.50)

## 2015-04-25 LAB — VITAMIN D 25 HYDROXY (VIT D DEFICIENCY, FRACTURES): VITD: 45.41 ng/mL (ref 30.00–100.00)

## 2015-04-25 NOTE — Assessment & Plan Note (Signed)
Reviewed preventive care protocols, scheduled due services, and updated immunizations Discussed nutrition, exercise, diet, and healthy lifestyle.  Pap smear done today.  Orders Placed This Encounter  Procedures  . MM Digital Screening  . CBC with Differential/Platelet  . Comprehensive metabolic panel  . Lipid panel  . TSH  . Vitamin B12  . Vitamin D, 25-hydroxy

## 2015-04-25 NOTE — Assessment & Plan Note (Signed)
Stable on current rxs.

## 2015-04-25 NOTE — Progress Notes (Signed)
Very pleasant 44 yo here for CPX and follow up of chronic medical conditions.  Depression- well controlled on Celexa 40 mg daily.   Takes Melatonin 5 mg at bedtime which typically helps, sometimes occasionally needs Ambien.  More fatigued lately with decreased exercise tolerance. No CP, SOB. Does have a h/o Grave's disease.  Denies any other symptoms of hypo or hyperthyroidism. Lab Results  Component Value Date   TSH 1.04 01/14/2012    Has never had a mammogram. No family h/o breast or ovarian CA.   Patient Active Problem List   Diagnosis Date Noted  . Encounter for routine gynecological examination 04/25/2015  . Perimenopause 05/24/2013  . Routine general medical examination at a health care facility 01/14/2012  . Grave's disease 02/27/2011  . URINARY INCONTINENCE, STRESS, FEMALE 12/19/2009  . VERTIGO 08/21/2009  . 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 HEMORRHOIDS, EXTERNAL 04/25/2008  . Insomnia 11/30/2007  . [place] DISEASE 05/11/2007  . Depression 05/11/2007   Past Medical History  Diagnosis Date  . Grave's disease     Methimazole and PTU  . Other and unspecified ovarian cysts   . Depression 2000   Past Surgical History  Procedure Laterality Date  . Cholecystectomy    . Tubal ligation      with chole  . Inguinal hernia repair      bilateral  . Femur im rod removal  1998    benign tumor   Social History  Substance Use Topics  . Smoking status: Never Smoker   . Smokeless tobacco: None  . Alcohol Use: Yes   Family History  Problem Relation Age of Onset  . Bipolar disorder Mother   . Personality disorder Mother    No Known Allergies Current Outpatient Prescriptions on File Prior to Visit  Medication Sig Dispense Refill  . ALPRAZolam (XANAX) 0.25 MG tablet TAKE ONE TABLET BY MOUTH THREE TIMES DAILY AS NEEDED 90 tablet 0  . citalopram (CELEXA) 40 MG tablet 1 tab po daily. 90 tablet 3  . Melatonin 5 MG CAPS Take 1 capsule by mouth at bedtime.      .  norethindrone-ethinyl estradiol 1/35 ([place] 1/35, 28,) tablet Take 1 tablet by mouth daily. Do not take the 4th week placebo pills, start a new pack on the 21st day. 3 Package 6  . zolpidem (AMBIEN) 10 MG tablet Take 1 tablet (10 mg total) by mouth at bedtime as needed for sleep. 30 tablet 0   No current facility-administered medications on file prior to visit.   The PMH, PSH, Social History, Family History, Medications, and allergies have been reviewed in [facility], and have been updated if relevant.  Review of Systems  Constitutional: Negative.   HENT: Negative.   Eyes: Negative.   Respiratory: Negative.   Cardiovascular: Negative.   Gastrointestinal: Negative.   Endocrine: Negative.   Genitourinary: Negative.   Musculoskeletal: Negative.   Skin: Negative.   Allergic/Immunologic: Negative.   Neurological: Negative.   Hematological: Negative.   Psychiatric/Behavioral: Negative.   All other systems reviewed and are negative.     Marland Kitchen  Physical Exam BP 102/64 mmHg  Pulse 45  Temp(Src) 98 F (36.7 C) (Oral)  Ht 5' 4.25" (1.632 m)  Wt 132 lb 12 oz (60.215 kg)  BMI 22.61 kg/m2  SpO2 99%  . General:  Well-developed,well-nourished,in no acute distress; alert,appropriate and cooperative throughout examination Head:  normocephalic and atraumatic.   Eyes:  vision grossly intact, pupils equal, pupils round, and pupils reactive to light.   Ears:  R ear normal and L ear normal.   Nose:  no external deformity.   Mouth:  good dentition.   Neck:  No deformities, masses, or tenderness noted. Breasts:  No mass, nodules, thickening, tenderness, bulging, retraction, inflamation, nipple discharge or skin changes noted.   Lungs:  Normal respiratory effort, chest expands symmetrically. Lungs are clear to auscultation, no crackles or wheezes. Heart:  Normal rate and regular rhythm. S1 and S2 normal without gallop, murmur, click, rub or other extra sounds. Abdomen:  Bowel sounds positive,abdomen  soft and non-tender without masses, organomegaly or hernias noted. Rectal:  no external abnormalities.   Genitalia:  Pelvic Exam:        External: normal female genitalia without lesions or masses        Vagina: normal without lesions or masses        Cervix: normal without lesions or masses        Adnexa: normal bimanual exam without masses or fullness        Uterus: normal by palpation        Pap smear: performed Msk:  No deformity or scoliosis noted of thoracic or lumbar spine.   Extremities:  No clubbing, cyanosis, edema, or deformity noted with normal full range of motion of all joints.   Neurologic:  alert & oriented X3 and gait normal.   Skin:  Intact without suspicious lesions or rashes Cervical Nodes:  No lymphadenopathy noted Axillary Nodes:  No palpable lymphadenopathy Psych:  Cognition and judgment appear intact. Alert and cooperative with normal attention span and concentration. No apparent delusions, illusions, hallucinations

## 2015-04-25 NOTE — Patient Instructions (Signed)
Good to see you! Happy birthday my sweet friend!

## 2015-04-25 NOTE — Addendum Note (Signed)
Addended by: Modena Nunnery on: 04/25/2015 10:43 AM   Modules accepted: Orders

## 2015-04-25 NOTE — Progress Notes (Signed)
Pre visit review using our clinic review tool, if applicable. No additional management support is needed unless otherwise documented below in the visit note. 

## 2015-04-26 ENCOUNTER — Encounter: Payer: Self-pay | Admitting: *Deleted

## 2015-04-30 LAB — CYTOLOGY - PAP

## 2015-05-23 ENCOUNTER — Other Ambulatory Visit: Payer: Self-pay | Admitting: *Deleted

## 2015-05-23 MED ORDER — ALPRAZOLAM 0.25 MG PO TABS
ORAL_TABLET | ORAL | Status: DC
Start: 2015-05-23 — End: 2015-09-04

## 2015-05-23 NOTE — Telephone Encounter (Signed)
Rx called in to requested pharmacy 

## 2015-05-23 NOTE — Telephone Encounter (Signed)
Last f/u 04/2015-CPE

## 2015-06-06 ENCOUNTER — Other Ambulatory Visit: Payer: Self-pay | Admitting: *Deleted

## 2015-06-06 NOTE — Telephone Encounter (Signed)
Last f/u 04/2015-CPE

## 2015-06-07 MED ORDER — ZOLPIDEM TARTRATE 10 MG PO TABS: 10.0000 mg | ORAL_TABLET | Freq: Every evening | ORAL | Status: DC | PRN

## 2015-06-07 NOTE — Telephone Encounter (Signed)
Rx called in to requested pharmacy 

## 2015-06-12 ENCOUNTER — Telehealth: Payer: Self-pay | Admitting: *Deleted

## 2015-06-12 MED ORDER — ATOVAQUONE-PROGUANIL HCL 250-100 MG PO TABS: 1.0000 | ORAL_TABLET | Freq: Every day | ORAL | Status: DC

## 2015-06-12 NOTE — Telephone Encounter (Signed)
Discussed with pt who requests Malarone 250/100- 30 day supply to [facility].  eR xent.

## 2015-06-12 NOTE — Telephone Encounter (Signed)
Pt contacted office and states she will be traveling to [place], and is requesting anti-malaria medication. 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 pls send to [place]

## 2015-08-09 ENCOUNTER — Other Ambulatory Visit: Payer: Self-pay | Admitting: Family Medicine

## 2015-09-04 ENCOUNTER — Other Ambulatory Visit: Payer: Self-pay | Admitting: *Deleted

## 2015-09-04 MED ORDER — ALPRAZOLAM 0.25 MG PO TABS: ORAL_TABLET | ORAL | Status: DC

## 2015-09-04 NOTE — Telephone Encounter (Signed)
Last f/u 04/2015-CPE 

## 2015-09-05 NOTE — Telephone Encounter (Signed)
Rx called in to requested pharmacy 

## 2015-10-11 ENCOUNTER — Other Ambulatory Visit: Payer: Self-pay | Admitting: Family Medicine

## 2015-11-01 ENCOUNTER — Other Ambulatory Visit: Payer: Self-pay | Admitting: Family Medicine

## 2015-11-01 NOTE — Telephone Encounter (Signed)
Rx called in to requested pharmacy 

## 2015-11-01 NOTE — Telephone Encounter (Signed)
Ok to phone in [place]. Does not need OV. Per note from 04/2015, pt only takes occasionally.

## 2015-11-01 NOTE — Telephone Encounter (Signed)
Is pt needing OV to restart? Last filled 05/2015

## 2015-11-25 ENCOUNTER — Other Ambulatory Visit: Payer: Self-pay | Admitting: Family Medicine

## 2015-11-29 ENCOUNTER — Other Ambulatory Visit: Payer: Self-pay | Admitting: Family Medicine

## 2015-11-29 NOTE — Telephone Encounter (Signed)
Left refill on voice mail at pharmacy  

## 2015-11-29 NOTE — Telephone Encounter (Signed)
Last filled 09-05-15 #90 Last OV 05-05-15 No future OV scheduled

## 2016-01-16 ENCOUNTER — Other Ambulatory Visit: Payer: Self-pay | Admitting: Family Medicine

## 2016-01-16 NOTE — Telephone Encounter (Signed)
Last f/u 04/2015-CPE

## 2016-01-16 NOTE — Telephone Encounter (Signed)
Rx called in to requested pharmacy 

## 2016-02-15 ENCOUNTER — Other Ambulatory Visit: Payer: Self-pay | Admitting: Family Medicine

## 2016-02-15 NOTE — Telephone Encounter (Signed)
Last f/u 04/2015-CPE

## 2016-02-15 NOTE — Telephone Encounter (Signed)
rx called in to requested pharmacy 

## 2016-04-24 ENCOUNTER — Other Ambulatory Visit: Payer: Self-pay | Admitting: Family Medicine

## 2016-04-24 NOTE — Telephone Encounter (Signed)
Last f/u 04/2015-CPE

## 2016-04-26 ENCOUNTER — Other Ambulatory Visit: Payer: Self-pay | Admitting: Family Medicine

## 2016-04-28 NOTE — Telephone Encounter (Signed)
Rx called in to requested pharmacy 

## 2016-05-18 ENCOUNTER — Other Ambulatory Visit: Payer: Self-pay | Admitting: Family Medicine

## 2016-05-28 ENCOUNTER — Other Ambulatory Visit: Payer: Self-pay | Admitting: Family Medicine

## 2016-06-02 ENCOUNTER — Other Ambulatory Visit: Payer: Self-pay | Admitting: Family Medicine

## 2016-06-03 NOTE — Telephone Encounter (Signed)
Pt has not has not been see since 04/2015

## 2016-06-03 NOTE — Telephone Encounter (Signed)
Rx called in to requested pharmacy 

## 2016-06-26 ENCOUNTER — Other Ambulatory Visit: Payer: Self-pay | Admitting: Family Medicine

## 2016-06-27 MED ORDER — NORETHINDRONE-ETH ESTRADIOL 1-35 MG-MCG PO TABS: 1.0000 | ORAL_TABLET | Freq: Every day | ORAL | 0 refills | Status: DC

## 2016-06-27 NOTE — Addendum Note (Signed)
Addended by: Helene Shoe on: 06/27/2016 10:58 AM   Modules accepted: Orders

## 2016-06-27 NOTE — Telephone Encounter (Signed)
Pt called and scheduled CPX on 07/21/16; pt is going out of country tomorrow; Morey Hummingbird advised pt will refill x 1 to [facility]. The refill refused was for [facility] 1/35; I spoke with Lennette Bihari at [facility] and he said Jodi Mour[place] 1/35 is same as [place] 1/35. Refill done.

## 2016-07-16 ENCOUNTER — Other Ambulatory Visit: Payer: Self-pay | Admitting: Family Medicine

## 2016-07-16 NOTE — Telephone Encounter (Signed)
This pt has not been seen since 04/2015. pls advise

## 2016-07-17 NOTE — Telephone Encounter (Signed)
Rx called in to requested pharmacy 

## 2016-07-17 NOTE — Telephone Encounter (Signed)
Evelyn Alexander with [facility] left v/m requesting quantity for alprazolam. Horris Latino notified quantity was # 90 x 0 . Horris Latino voiced understanding.

## 2016-07-17 NOTE — Telephone Encounter (Signed)
Pt called upset because pharmacy advised pt refill for alprazolam denied because Dr wanted pt on different regimen. I apologized to pt that I did not see any info about  Denial of refill.  I called [facility] spoke with [facility] pharmacist and rx is on v/m but is not ready for pick up. Will be at least one hr before ready for pick up. Pt notified;  Was appreciative and voiced understanding.

## 2016-07-21 ENCOUNTER — Encounter: Payer: Self-pay | Admitting: Family Medicine

## 2016-07-21 ENCOUNTER — Ambulatory Visit (INDEPENDENT_AMBULATORY_CARE_PROVIDER_SITE_OTHER): Payer: BC Managed Care – PPO | Admitting: Family Medicine

## 2016-07-21 VITALS — BP 120/60 | HR 46 | Temp 98.1°F | Ht 64.25 in | Wt 134.8 lb

## 2016-07-21 DIAGNOSIS — G4700 Insomnia, unspecified: Secondary | ICD-10-CM | POA: Diagnosis not present

## 2016-07-21 DIAGNOSIS — F329 Major depressive disorder, single episode, unspecified: Secondary | ICD-10-CM

## 2016-07-21 DIAGNOSIS — Z1231 Encounter for screening mammogram for malignant neoplasm of breast: Secondary | ICD-10-CM | POA: Diagnosis not present

## 2016-07-21 DIAGNOSIS — Z01419 Encounter for gynecological examination (general) (routine) without abnormal findings: Secondary | ICD-10-CM

## 2016-07-21 DIAGNOSIS — N951 Menopausal and female climacteric states: Secondary | ICD-10-CM

## 2016-07-21 DIAGNOSIS — E0500 Thyrotoxicosis with diffuse goiter without thyrotoxic crisis or storm: Secondary | ICD-10-CM

## 2016-07-21 DIAGNOSIS — F32A Depression, unspecified: Secondary | ICD-10-CM

## 2016-07-21 DIAGNOSIS — Z1239 Encounter for other screening for malignant neoplasm of breast: Secondary | ICD-10-CM

## 2016-07-21 DIAGNOSIS — Z0000 Encounter for general adult medical examination without abnormal findings: Secondary | ICD-10-CM | POA: Diagnosis not present

## 2016-07-21 LAB — CBC WITH DIFFERENTIAL/PLATELET
BASOS ABS: 0 10*3/uL (ref 0.0–0.1)
BASOS PCT: 0.4 % (ref 0.0–3.0)
EOS ABS: 0.1 10*3/uL (ref 0.0–0.7)
Eosinophils Relative: 1.9 % (ref 0.0–5.0)
HEMATOCRIT: 38.7 % (ref 36.0–46.0)
Hemoglobin: 13.1 g/dL (ref 12.0–15.0)
LYMPHS PCT: 31.4 % (ref 12.0–46.0)
Lymphs Abs: 1.8 10*3/uL (ref 0.7–4.0)
MCHC: 33.8 g/dL (ref 30.0–36.0)
MCV: 90 fl (ref 78.0–100.0)
MONO ABS: 0.4 10*3/uL (ref 0.1–1.0)
Monocytes Relative: 6.6 % (ref 3.0–12.0)
NEUTROS ABS: 3.4 10*3/uL (ref 1.4–7.7)
NEUTROS PCT: 59.7 % (ref 43.0–77.0)
PLATELETS: 233 10*3/uL (ref 150.0–400.0)
RBC: 4.3 Mil/uL (ref 3.87–5.11)
RDW: 13.4 % (ref 11.5–15.5)
WBC: 5.7 10*3/uL (ref 4.0–10.5)

## 2016-07-21 LAB — COMPREHENSIVE METABOLIC PANEL
ALBUMIN: 3.9 g/dL (ref 3.5–5.2)
ALK PHOS: 40 U/L (ref 39–117)
ALT: 22 U/L (ref 0–35)
AST: 19 U/L (ref 0–37)
BUN: 10 mg/dL (ref 6–23)
CHLORIDE: 107 meq/L (ref 96–112)
CO2: 24 mEq/L (ref 19–32)
CREATININE: 0.95 mg/dL (ref 0.40–1.20)
Calcium: 9.4 mg/dL (ref 8.4–10.5)
GFR: 67.24 mL/min (ref >60.00)
GLUCOSE: 92 mg/dL (ref 70–99)
POTASSIUM: 4.3 meq/L (ref 3.5–5.1)
SODIUM: 139 meq/L (ref 135–145)
TOTAL PROTEIN: 7.5 g/dL (ref 6.0–8.3)
Total Bilirubin: 0.7 mg/dL (ref 0.2–1.2)

## 2016-07-21 LAB — LIPID PANEL
Cholesterol: 161 mg/dL (ref 0–200)
HDL: 34.5 mg/dL — ABNORMAL LOW (ref >39.00)
LDL CALC: 112 mg/dL — AB (ref 0–99)
NONHDL: 126.74
Total CHOL/HDL Ratio: 5
Triglycerides: 72 mg/dL (ref 0.0–149.0)
VLDL: 14.4 mg/dL (ref 0.0–40.0)

## 2016-07-21 LAB — TSH: TSH: 1.11 u[IU]/mL (ref 0.35–4.50)

## 2016-07-21 MED ORDER — CITALOPRAM HYDROBROMIDE 40 MG PO TABS: 40.0000 mg | ORAL_TABLET | Freq: Every day | ORAL | 3 refills | Status: DC

## 2016-07-21 MED ORDER — CITALOPRAM HYDROBROMIDE 40 MG PO TABS
40.0000 mg | ORAL_TABLET | Freq: Every day | ORAL | 3 refills | Status: DC
Start: 2016-07-21 — End: 2016-07-21

## 2016-07-21 MED ORDER — ZOLPIDEM TARTRATE ER 12.5 MG PO TBCR: 12.5000 mg | EXTENDED_RELEASE_TABLET | Freq: Every evening | ORAL | 1 refills | Status: DC | PRN

## 2016-07-21 MED ORDER — LEVONORGEST-ETH ESTRAD 91-DAY 0.15-0.03 &0.01 MG PO TABS: 1.0000 | ORAL_TABLET | Freq: Every day | ORAL | 4 refills | Status: DC

## 2016-07-21 NOTE — Progress Notes (Signed)
Subjective:   Patient ID: Evelyn Sabin M.D. [place], female    DOB: [birth year], 45 y.o.   MRN: [id number]  Evelyn Alexander is a pleasant 45 y.o. year old female who presents to clinic today with Annual Exam  and follow up of chronic medical conditions on 07/21/2016  HPI:  Last pap smear done by me on 04/25/15 Has never had a mammogram but it has been ordered in the past.  Depression- symptoms have been well controlled on Celexa 40 mg daily. Takes xanax infrequently for anxiety.  Ambien- persistent issue.  Takes Melatonin and Ambien for persistent insomnia as needed. Has some nights that she wakes up a few hours after taking ambien. She does over night shifts.  Wants to try an OCP that will allow her to skip periods.  Lab Results  Component Value Date   CHOL 162 04/25/2015   HDL 32.40 (L) 04/25/2015   LDLCALC 112 (H) 04/25/2015   TRIG 90.0 04/25/2015   CHOLHDL 5 04/25/2015   Lab Results  Component Value Date   ALT 26 04/25/2015   AST 26 04/25/2015   ALKPHOS 39 04/25/2015   BILITOT 0.6 04/25/2015   Lab Results  Component Value Date   TSH 0.99 04/25/2015   Lab Results  Component Value Date   NA 141 04/25/2015   K 3.8 04/25/2015   CL 107 04/25/2015   CO2 28 04/25/2015   Lab Results  Component Value Date   WBC 4.1 04/25/2015   HGB 13.4 04/25/2015   HCT 39.7 04/25/2015   MCV 94.5 04/25/2015   PLT 204.0 04/25/2015   Current Outpatient Prescriptions on File Prior to Visit  Medication Sig Dispense Refill  . ALPRAZolam (XANAX) 0.25 MG tablet TAKE ONE TABLET BY MOUTH THREE TIMES DAILY AS NEEDED 90 tablet 0  . Melatonin 5 MG CAPS Take 1 capsule by mouth at bedtime.       No current facility-administered medications on file prior to visit.     No Known Allergies  Past Medical History:  Diagnosis Date  . Depression 2000  . Grave's disease    Methimazole and PTU  . Other and unspecified ovarian cysts     Past Surgical History:  Procedure  Laterality Date  . CHOLECYSTECTOMY    . FEMUR IM ROD REMOVAL  1998   benign tumor  . INGUINAL HERNIA REPAIR     bilateral  . TUBAL LIGATION     with chole    Family History  Problem Relation Age of Onset  . Bipolar disorder Mother   . Personality disorder Mother     Social History   Social History  . Marital status: Married    Spouse name: N/A  . Number of children: 2  . Years of education: N/A   Occupational History  . 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 Smoking status: Never Smoker  . Smokeless tobacco: Not on file  . Alcohol use Yes  . Drug use: Unknown  . Sexual activity: Not on file   Other Topics Concern  . Not on file   Social History Narrative  . No narrative on file   The PMH, PSH, Social History, Family History, Medications, and allergies have been reviewed in [facility], and have been updated if relevant.    Review of Systems  Constitutional: Negative.   HENT: Negative.   Eyes: Negative.   Respiratory: Negative.   Cardiovascular: Negative.   Gastrointestinal: Negative.   Endocrine:  Negative.   Genitourinary: Negative.   Musculoskeletal: Negative.   Skin: Negative.   Neurological: Negative.   Hematological: Negative.   Psychiatric/Behavioral: Positive for sleep disturbance. Negative for behavioral problems, confusion, decreased concentration, dysphoric mood, hallucinations, self-injury and suicidal ideas. The patient is not nervous/anxious and is not hyperactive.   All other systems reviewed and are negative.      Objective:    BP 120/60   Pulse (!) 46   Temp 98.1 F (36.7 C) (Oral)   Ht 5' 4.25" (1.632 m)   Wt 134 lb 12 oz (61.1 kg)   SpO2 99%   BMI 22.95 kg/m    Physical Exam   General:  Well-developed,well-nourished,in no acute distress; alert,appropriate and cooperative throughout examination Head:  normocephalic and atraumatic.   Eyes:  vision grossly intact, PERRL Ears:  R ear normal and L ear normal externally,  TMs clear bilaterally Nose:  no external deformity.   Mouth:  good dentition.   Neck:  No deformities, masses, or tenderness noted. Breasts:  No mass, nodules, thickening, tenderness, bulging, retraction, inflamation, nipple discharge or skin changes noted.   Lungs:  Normal respiratory effort, chest expands symmetrically. Lungs are clear to auscultation, no crackles or wheezes. Heart:  Normal rate and regular rhythm. S1 and S2 normal without gallop, murmur, click, rub or other extra sounds. Abdomen:  Bowel sounds positive,abdomen soft and non-tender without masses, organomegaly or hernias noted. Msk:  No deformity or scoliosis noted of thoracic or lumbar spine.   Extremities:  No clubbing, cyanosis, edema, or deformity noted with normal full range of motion of all joints.   Neurologic:  alert & oriented X3 and gait normal.   Skin:  Intact without suspicious lesions or rashes Cervical Nodes:  No lymphadenopathy noted Axillary Nodes:  No palpable lymphadenopathy Psych:  Cognition and judgment appear intact. Alert and cooperative with normal attention span and concentration. No apparent delusions, illusions, hallucinations       Assessment & Plan:   Routine general medical examination at a health care facility - Plan: CBC with Differential/Platelet, Comprehensive metabolic panel, Lipid panel, TSH  Encounter for gynecological examination without abnormal finding  Insomnia, unspecified type  Depression, unspecified depression type  Graves' disease  Screening for breast cancer - Plan: MM Digital Screening No Follow-up on file.

## 2016-07-21 NOTE — Progress Notes (Signed)
Pre visit review using our clinic review tool, if applicable. No additional management support is needed unless otherwise documented below in the visit note. 

## 2016-07-21 NOTE — Assessment & Plan Note (Signed)
Stable on current dose of celexa. No changes made today.

## 2016-07-21 NOTE — Assessment & Plan Note (Signed)
Reviewed preventive care protocols, scheduled due services, and updated immunizations Discussed nutrition, exercise, diet, and healthy lifestyle.  

## 2016-07-21 NOTE — Assessment & Plan Note (Signed)
Symptoms improved with OCPs. Change to seasonique as she would like to avoid having periods. The patient indicates understanding of these issues and agrees with the plan.

## 2016-07-21 NOTE — Assessment & Plan Note (Addendum)
Deteriorated. Change rx to long acting ambien. I would like to avoid trazodone although it would be a great option for her given that she is pleased with current dose of celexa.  In combination could cause prolonged QT. Call or return to clinic prn if these symptoms worsen or fail to improve as anticipated. The patient indicates understanding of these issues and agrees with the plan.

## 2016-07-22 ENCOUNTER — Encounter: Payer: Self-pay | Admitting: *Deleted

## 2016-08-26 ENCOUNTER — Other Ambulatory Visit: Payer: Self-pay | Admitting: Family Medicine

## 2016-09-23 ENCOUNTER — Other Ambulatory Visit: Payer: Self-pay | Admitting: Family Medicine

## 2016-09-23 NOTE — Telephone Encounter (Signed)
Last f/u 07/2016-CPE

## 2016-09-23 NOTE — Telephone Encounter (Signed)
Rx called in to requested pharmacy 

## 2016-11-07 ENCOUNTER — Other Ambulatory Visit: Payer: Self-pay

## 2016-11-07 MED ORDER — ALPRAZOLAM 0.25 MG PO TABS: 0.2500 mg | ORAL_TABLET | Freq: Three times a day (TID) | ORAL | 0 refills | Status: DC | PRN

## 2016-11-07 NOTE — Telephone Encounter (Signed)
Ok to phone in Xanax 

## 2016-11-07 NOTE — Telephone Encounter (Signed)
Last filled 09-23-16 #90 Last OV 07-21-16 No Future OV

## 2016-11-07 NOTE — Telephone Encounter (Signed)
Left refill on voice mail at pharmacy  

## 2016-11-11 ENCOUNTER — Other Ambulatory Visit: Payer: Self-pay | Admitting: Family Medicine

## 2016-11-11 NOTE — Telephone Encounter (Signed)
Last pt seen 07/21/2016,  Last refill 07/21/2016.Marland KitchenMarland Kitchenplease advise

## 2016-11-12 NOTE — Telephone Encounter (Signed)
Called pharmacy for Rx refilled

## 2016-12-29 ENCOUNTER — Other Ambulatory Visit: Payer: Self-pay | Admitting: Family Medicine

## 2016-12-29 NOTE — Telephone Encounter (Signed)
Called in to [facility] [place], [place] - [address]: [phone]  

## 2016-12-29 NOTE — Telephone Encounter (Signed)
St refill 11/11/16, last OV 07/21/16

## 2017-02-16 ENCOUNTER — Other Ambulatory Visit: Payer: Self-pay | Admitting: Family Medicine

## 2017-02-16 NOTE — Telephone Encounter (Signed)
Last refill ambien 12/29/16 Xanax 11/07/16 Last OV 07/21/16  Ok to refill?

## 2017-02-16 NOTE — Telephone Encounter (Signed)
Called in to [facility] [place] - [address]: [phone]  

## 2017-03-30 ENCOUNTER — Other Ambulatory Visit: Payer: Self-pay | Admitting: Family Medicine

## 2017-03-30 NOTE — Telephone Encounter (Signed)
Patient left a voicemail stating that her pharmacy requested a refill on Ambien. Patient stated that her [place] is coming to the beach this [place] and would like the refill done so that he can bring it to her. Last refill 02/16/17 #30 Last office visit 07/21/16 Dr. Deborra Medina is out of the office today. [place]

## 2017-03-30 NOTE — Telephone Encounter (Signed)
Rx called in to pharmacy. 

## 2017-03-30 NOTE — Telephone Encounter (Signed)
Ok to phone in Ambien 

## 2017-04-09 ENCOUNTER — Other Ambulatory Visit: Payer: Self-pay | Admitting: Family Medicine

## 2017-04-10 NOTE — Telephone Encounter (Signed)
This medication is not in her current med list, would you like to refill it?

## 2017-05-07 ENCOUNTER — Other Ambulatory Visit: Payer: Self-pay | Admitting: Family Medicine

## 2017-05-07 NOTE — Telephone Encounter (Signed)
Rafael Capo left v/m requesting refill zolpidem CR. Last refilled # 30 on 03/30/17. Last annual 07/21/16.

## 2017-05-07 NOTE — Telephone Encounter (Signed)
Called in to [place], [place] [address]: [phone]

## 2017-05-12 ENCOUNTER — Other Ambulatory Visit: Payer: Self-pay | Admitting: Family Medicine

## 2017-05-28 ENCOUNTER — Other Ambulatory Visit: Payer: Self-pay | Admitting: Family Medicine

## 2017-05-28 NOTE — Telephone Encounter (Signed)
Faxed to pharmacy/thx dmf

## 2017-05-28 NOTE — Telephone Encounter (Signed)
Requesting: Alprazolam Contract: None UDS: None Last OV: 12.04.2017 Next OV: Not scheduled Last Refill: 7.02.2018   Please advise

## 2017-06-23 ENCOUNTER — Other Ambulatory Visit: Payer: Self-pay | Admitting: Family Medicine

## 2017-06-23 MED ORDER — ZOLPIDEM TARTRATE ER 12.5 MG PO TBCR: 12.5000 mg | EXTENDED_RELEASE_TABLET | Freq: Every evening | ORAL | 0 refills | Status: DC | PRN

## 2017-06-23 MED ORDER — ZOLPIDEM TARTRATE ER 12.5 MG PO TBCR
12.5000 mg | EXTENDED_RELEASE_TABLET | Freq: Every evening | ORAL | 0 refills | Status: DC | PRN
Start: 2017-06-23 — End: 2017-08-10

## 2017-06-23 NOTE — Telephone Encounter (Signed)
Rx faxed to [facility] per TA/thx dmf

## 2017-08-07 ENCOUNTER — Telehealth: Payer: Self-pay | Admitting: Family Medicine

## 2017-08-07 NOTE — Telephone Encounter (Signed)
Copied from [place] [phone]. Topic: Inquiry >> Aug 07, 2017  2:52 PM Pricilla Handler wrote: Reason for CRM: Patient called requesting a refill of Zolpidem (AMBIEN CR) 12.5 MG CR tablet. Patient needs this medication before she leaves to go out of town next week. Patient has requested a call once medication has been sent to her pharmacy. Patient's preferred pharmacy is [facility], [place] [address] [phone] (Phone) [phone] (Fax)    >> Aug 07, 2017  3:15 PM Romana Juniper, RN wrote: Rx is in process at office- awaiting provider approval.

## 2017-08-07 NOTE — Telephone Encounter (Signed)
Copied from [place] [id number]. Topic: Inquiry >> Aug 07, 2017  2:52 PM Pricilla Handler wrote: Reason for CRM: Patient called requesting a refill of Zolpidem (AMBIEN CR) 12.5 MG CR tablet. Patient needs this medication before she leaves to go out of town next week. Patient has requested a call once medication has been sent to her pharmacy. Patient's preferred pharmacy is [facility], [place] [address] [phone] (Phone) [phone] (Fax)

## 2017-08-07 NOTE — Telephone Encounter (Signed)
Ok to print prescription for 14tabs only

## 2017-08-07 NOTE — Telephone Encounter (Signed)
Please advise on refill for Ambien.  LOV was 07/21/16.

## 2017-08-07 NOTE — Telephone Encounter (Signed)
Requesting: Ambien Contract: No UDS: None Last OV: 12.04.2017 Next OV: Not scheduled Last Refill: 11.06.2018   Please advise

## 2017-08-08 ENCOUNTER — Other Ambulatory Visit: Payer: Self-pay | Admitting: Family Medicine

## 2017-08-10 ENCOUNTER — Telehealth: Payer: Self-pay | Admitting: Family Medicine

## 2017-08-10 MED ORDER — ZOLPIDEM TARTRATE ER 12.5 MG PO TBCR: 12.5000 mg | EXTENDED_RELEASE_TABLET | Freq: Every evening | ORAL | 0 refills | Status: DC | PRN

## 2017-08-10 NOTE — Telephone Encounter (Signed)
Xanax will have to be authorized by Dr. Deborra Medina when she returns

## 2017-08-10 NOTE — Telephone Encounter (Signed)
Please advise 

## 2017-08-10 NOTE — Telephone Encounter (Signed)
Copied from [place] [phone]. Topic: Quick Communication - Rx Refill/Question >> Aug 10, 2017  8:58 AM Arletha Grippe wrote: Has the patient contacted their pharmacy? No. New rx    (Agent: If no, request that the patient contact the pharmacy for the refill.)   Preferred Pharmacy (with phone number or street name): pt would like to have refill on ALPRAZolam (XANAX) 0.25 MG tablet. Please call [phone] if any questions. Pt uses [facility]    Agent: Please be advised that RX refills may take up to 3 business days. We ask that you follow-up with your pharmacy.

## 2017-08-10 NOTE — Telephone Encounter (Signed)
Per CN ok for #14 only/I called in to pharm with note to have pt sched appt for future fills/thx dmf

## 2017-08-10 NOTE — Telephone Encounter (Signed)
No recent office visit / No future appointments / Refill request for xanax

## 2017-08-12 ENCOUNTER — Other Ambulatory Visit: Payer: Self-pay | Admitting: Family Medicine

## 2017-08-12 NOTE — Telephone Encounter (Signed)
Left vm for the pt to call back, need to inform of massage below.   Evelyn Alexander will address birth control if she is here for an office visit but not for Xanax and Ambien (this two need to wait for her PCP).

## 2017-08-12 NOTE — Telephone Encounter (Signed)
Patient contacted me while I was on vacation asking why her xanax rx wasn't refilled.  She was last seen one year ago but I very reliable.  She is leaving for a mission trip to [place] and needs her rxs ASAP.  She has been on same rxs for 4 years.  Okay to refill her Lorrin Mais, xanax as entered.  She will schedule a follow up upon her return. Thank you!

## 2017-08-12 NOTE — Telephone Encounter (Signed)
Pt called back and information given to pt per notes of Noitamyae,LPN on 20/94. 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 Pt also states she will be leaving the country on Jan. 2nd to go to [place] for a month and would like to have the medication before she leaves. Pt states she has been on these medications for 4 years with no changes and does not understand why the medications can not be filled prior to Dr. Deborra Medina returning on 12/31. Informed pt that message will be routed to the practice voicing her concerns. Pt states she will just have to message Dr. Deborra Medina to see if she will refill the medication while she is on vacation. Pt is upset that medication can not be filled and is requesting for Dr. Ethelene Hal to call her, directly with an explanation as to why the medication can not be filled until Dr. Deborra Medina returns.

## 2017-08-12 NOTE — Telephone Encounter (Signed)
Patient called back and also is requesting her birth control & ambien.

## 2017-08-12 NOTE — Telephone Encounter (Signed)
Pt is not happy that the xanax cannot be refilled until Dr. Deborra Medina returns on 12/31 because she is leaving the country for a month. Requesting a dr to call her asap.

## 2017-08-12 NOTE — Telephone Encounter (Signed)
Dr. Ethelene Hal will not refill until Dr. Deborra Medina gets back.

## 2017-08-12 NOTE — Telephone Encounter (Signed)
Okay to fill enough until Dr. Deborra Medina is back on 12/31?

## 2017-08-12 NOTE — Telephone Encounter (Signed)
Left vm for the pt to call back, need to inform her of massage below. Dr. Deborra Medina is coming back on 08/17/2017.

## 2017-08-13 ENCOUNTER — Other Ambulatory Visit: Payer: Self-pay | Admitting: *Deleted

## 2017-08-13 ENCOUNTER — Other Ambulatory Visit: Payer: Self-pay | Admitting: Family Medicine

## 2017-08-13 ENCOUNTER — Other Ambulatory Visit: Payer: Self-pay

## 2017-08-13 MED ORDER — ALPRAZOLAM 0.25 MG PO TABS: 0.2500 mg | ORAL_TABLET | Freq: Three times a day (TID) | ORAL | 0 refills | Status: DC | PRN

## 2017-08-13 MED ORDER — LEVONORGEST-ETH ESTRAD 91-DAY 0.15-0.03 &0.01 MG PO TABS: 1.0000 | ORAL_TABLET | Freq: Every day | ORAL | 0 refills | Status: DC

## 2017-08-13 MED ORDER — TRAZODONE HCL 50 MG PO TABS: 25.0000 mg | ORAL_TABLET | Freq: Every evening | ORAL | 3 refills | Status: DC | PRN

## 2017-08-13 MED ORDER — LEVONORGEST-ETH ESTRAD 91-DAY 0.15-0.03 &0.01 MG PO TABS: 1.0000 | ORAL_TABLET | Freq: Every day | ORAL | 0 refills | Status: AC

## 2017-08-13 MED ORDER — ZOLPIDEM TARTRATE ER 12.5 MG PO TBCR: 12.5000 mg | EXTENDED_RELEASE_TABLET | Freq: Every evening | ORAL | 0 refills | Status: DC | PRN

## 2017-08-13 NOTE — Telephone Encounter (Signed)
Thank you :)

## 2017-08-13 NOTE — Telephone Encounter (Signed)
Called in 30d of Alprazolam and Zolpidem per TA/Advised pharm to tell pt to sched appt w/i 30d/thx dmf

## 2017-08-13 NOTE — Telephone Encounter (Signed)
Tried to call pt twice to advise her req is complete/no VM set up/thx dmf

## 2017-08-13 NOTE — Telephone Encounter (Signed)
Please let patient know once Rx's have been sent to pharmacy.  Thank you!

## 2017-08-14 NOTE — Telephone Encounter (Signed)
Called and gave verbal to pharmacist on 12.27.18/thx dmf

## 2017-10-07 ENCOUNTER — Other Ambulatory Visit: Payer: Self-pay | Admitting: Family Medicine

## 2017-10-07 ENCOUNTER — Ambulatory Visit: Payer: BC Managed Care – PPO | Admitting: Family Medicine

## 2017-10-07 NOTE — Progress Notes (Signed)
Printed controlled substance contract for OV for 2.21.19/thx dmf

## 2017-10-08 ENCOUNTER — Ambulatory Visit: Payer: BC Managed Care – PPO | Admitting: Family Medicine

## 2017-10-10 ENCOUNTER — Other Ambulatory Visit: Payer: Self-pay | Admitting: Family Medicine

## 2017-10-14 NOTE — Telephone Encounter (Signed)
Rx last filled 12.27.18, okay to refill?

## 2017-10-15 NOTE — Telephone Encounter (Signed)
Faxed signed Rx to pharmacy/thx dmf

## 2017-10-25 ENCOUNTER — Other Ambulatory Visit: Payer: Self-pay | Admitting: Family Medicine

## 2017-11-03 ENCOUNTER — Other Ambulatory Visit (HOSPITAL_COMMUNITY)
Admission: RE | Admit: 2017-11-03 | Discharge: 2017-11-03 | Disposition: A | Discharge status: Home / Self Care | Payer: BC Managed Care – PPO | Source: Ambulatory Visit | Attending: Family Medicine | Admitting: Family Medicine

## 2017-11-03 ENCOUNTER — Ambulatory Visit: Payer: BC Managed Care – PPO | Admitting: Family Medicine

## 2017-11-03 VITALS — BP 110/60 | HR 50 | Temp 99.5°F | Ht 64.0 in | Wt 136.2 lb

## 2017-11-03 DIAGNOSIS — F329 Major depressive disorder, single episode, unspecified: Secondary | ICD-10-CM

## 2017-11-03 DIAGNOSIS — G4700 Insomnia, unspecified: Secondary | ICD-10-CM

## 2017-11-03 DIAGNOSIS — Z0000 Encounter for general adult medical examination without abnormal findings: Secondary | ICD-10-CM

## 2017-11-03 DIAGNOSIS — Z01419 Encounter for gynecological examination (general) (routine) without abnormal findings: Secondary | ICD-10-CM

## 2017-11-03 DIAGNOSIS — F32A Depression, unspecified: Secondary | ICD-10-CM

## 2017-11-03 LAB — CBC WITH DIFFERENTIAL/PLATELET
BASOS ABS: 0 10*3/uL (ref 0.0–0.1)
Basophils Relative: 0.6 % (ref 0.0–3.0)
Eosinophils Absolute: 0.2 10*3/uL (ref 0.0–0.7)
Eosinophils Relative: 3.8 % (ref 0.0–5.0)
HEMATOCRIT: 35.6 % — AB (ref 36.0–46.0)
HEMOGLOBIN: 12.1 g/dL (ref 12.0–15.0)
LYMPHS PCT: 29.7 % (ref 12.0–46.0)
Lymphs Abs: 1.5 10*3/uL (ref 0.7–4.0)
MCHC: 34.1 g/dL (ref 30.0–36.0)
MCV: 95.9 fl (ref 78.0–100.0)
MONOS PCT: 5.2 % (ref 3.0–12.0)
Monocytes Absolute: 0.3 10*3/uL (ref 0.1–1.0)
Neutro Abs: 3 10*3/uL (ref 1.4–7.7)
Neutrophils Relative %: 60.7 % (ref 43.0–77.0)
PLATELETS: 179 10*3/uL (ref 150.0–400.0)
RBC: 3.71 Mil/uL — AB (ref 3.87–5.11)
RDW: 13.2 % (ref 11.5–15.5)
WBC: 4.9 10*3/uL (ref 4.0–10.5)

## 2017-11-03 LAB — COMPREHENSIVE METABOLIC PANEL
ALBUMIN: 3.8 g/dL (ref 3.5–5.2)
ALK PHOS: 38 U/L — AB (ref 39–117)
ALT: 14 U/L (ref 0–35)
AST: 15 U/L (ref 0–37)
BILIRUBIN TOTAL: 0.5 mg/dL (ref 0.2–1.2)
BUN: 17 mg/dL (ref 6–23)
CALCIUM: 9.2 mg/dL (ref 8.4–10.5)
CO2: 24 meq/L (ref 19–32)
CREATININE: 0.89 mg/dL (ref 0.40–1.20)
Chloride: 107 mEq/L (ref 96–112)
GFR: 72.1 mL/min (ref >60.00)
Glucose, Bld: 92 mg/dL (ref 70–99)
Potassium: 3.7 mEq/L (ref 3.5–5.1)
Sodium: 138 mEq/L (ref 135–145)
TOTAL PROTEIN: 6.7 g/dL (ref 6.0–8.3)

## 2017-11-03 LAB — TSH: TSH: 0.67 u[IU]/mL (ref 0.35–4.50)

## 2017-11-03 LAB — LIPID PANEL
CHOL/HDL RATIO: 3
Cholesterol: 118 mg/dL (ref 0–200)
HDL: 37.9 mg/dL — ABNORMAL LOW (ref >39.00)
LDL Cholesterol: 66 mg/dL (ref 0–99)
NonHDL: 79.64
Triglycerides: 69 mg/dL (ref 0.0–149.0)
VLDL: 13.8 mg/dL (ref 0.0–40.0)

## 2017-11-03 MED ORDER — NORETHINDRONE-ETH ESTRADIOL 1-35 MG-MCG PO TABS: ORAL_TABLET | ORAL | 1 refills | Status: DC

## 2017-11-03 MED ORDER — ALPRAZOLAM 0.25 MG PO TABS: 0.2500 mg | ORAL_TABLET | Freq: Three times a day (TID) | ORAL | 1 refills | Status: AC | PRN

## 2017-11-03 MED ORDER — CITALOPRAM HYDROBROMIDE 40 MG PO TABS: 40.0000 mg | ORAL_TABLET | Freq: Every day | ORAL | 1 refills | Status: DC

## 2017-11-03 MED ORDER — ZOLPIDEM TARTRATE ER 12.5 MG PO TBCR: 12.5000 mg | EXTENDED_RELEASE_TABLET | Freq: Every evening | ORAL | 1 refills | Status: AC | PRN

## 2017-11-03 MED ORDER — TRAZODONE HCL 50 MG PO TABS: 25.0000 mg | ORAL_TABLET | Freq: Every evening | ORAL | 1 refills | Status: DC | PRN

## 2017-11-03 NOTE — Patient Instructions (Signed)
Good luck!  I will miss you!!!

## 2017-11-03 NOTE — Assessment & Plan Note (Signed)
Continue current rx. Refills sent.

## 2017-11-03 NOTE — Assessment & Plan Note (Signed)
Reviewed preventive care protocols, scheduled due services, and updated immunizations Discussed nutrition, exercise, diet, and healthy lifestyle.  Pap smear done today. 

## 2017-11-03 NOTE — Progress Notes (Signed)
Subjective:   Patient ID: Evelyn Alexander, female    DOB: [birth year], 47 y.o.   MRN: [id number]  Evelyn Alexander is a pleasant 47 y.o. year old female who presents to clinic today with Annual Exam (CPE. had 2 eggs and coffee at 930a if labs needed.)  and follow up of chronic medical conditions on 11/03/2017  HPI:  Health Maintenance  Topic Date Due  . INFLUENZA VACCINE  07/05/2018 (Originally 03/18/2017)  . PAP SMEAR  04/24/2018  . TETANUS/TDAP  12/20/2019  . HIV Screening  Completed   Last pap smear done by me on 04/25/15 Has never had a mammogram but it has been ordered in the past.  Depression- symptoms have been well controlled on Celexa 40 mg daily. Takes xanax infrequently for anxiety.  Ambien- persistent issue.  Takes Melatonin and Ambien for persistent insomnia as needed. Has some nights that she wakes up a few hours after taking ambien. She does over night shifts.    Lab Results  Component Value Date   CHOL 161 07/21/2016   HDL 34.50 (L) 07/21/2016   LDLCALC 112 (H) 07/21/2016   TRIG 72.0 07/21/2016   CHOLHDL 5 07/21/2016   Lab Results  Component Value Date   ALT 22 07/21/2016   AST 19 07/21/2016   ALKPHOS 40 07/21/2016   BILITOT 0.7 07/21/2016   Lab Results  Component Value Date   TSH 1.11 07/21/2016   Lab Results  Component Value Date   NA 139 07/21/2016   K 4.3 07/21/2016   CL 107 07/21/2016   CO2 24 07/21/2016   Lab Results  Component Value Date   WBC 5.7 07/21/2016   HGB 13.1 07/21/2016   HCT 38.7 07/21/2016   MCV 90.0 07/21/2016   PLT 233.0 07/21/2016   Current Outpatient Medications on File Prior to Visit  Medication Sig Dispense Refill  . ALPRAZolam (XANAX) 0.25 MG tablet TAKE ONE TABLET BY MOUTH THREE TIMES DAILY AS NEEDED FOR ANXIETY 90 tablet 0  . citalopram (CELEXA) 40 MG tablet TAKE ONE TABLET BY MOUTH ONCE DAILY 90 tablet 0  . Levonorgestrel-Ethinyl Estradiol (SEASONIQUE) 0.15-0.03 &0.01 MG tablet Take 1  tablet by mouth daily. 1 Package 0  . Melatonin 5 MG CAPS Take 1 capsule by mouth at bedtime.      Marland Kitchen PIRMELLA 1/35 tablet TAKE ONE TABLET BY MOUTH ONCE DAILY (VISIT  MD  FOR  PHYSICAL  FOR  ADDITIONAL  REFILLS) 84 tablet 0  . traZODone (DESYREL) 50 MG tablet Take 0.5-1 tablets (25-50 mg total) by mouth at bedtime as needed for sleep. 30 tablet 3  . zolpidem (AMBIEN CR) 12.5 MG CR tablet TAKE ONE TABLET BY MOUTH AT BEDTIME AS NEEDED 30 tablet 0   No current facility-administered medications on file prior to visit.     No Known Allergies  Past Medical History:  Diagnosis Date  . Depression 2000  . Grave's disease    Methimazole and PTU  . Other and unspecified ovarian cysts     Past Surgical History:  Procedure Laterality Date  . CHOLECYSTECTOMY    . FEMUR IM ROD REMOVAL  1998   benign tumor  . INGUINAL HERNIA REPAIR     bilateral  . TUBAL LIGATION     with chole    Family History  Problem Relation Age of Onset  . Bipolar disorder Mother   . Personality disorder Mother     Social History   Socioeconomic History  . Marital  status: Married    Spouse name: Not on file  . Number of children: 2  . Years of education: Not on file  . Highest education level: Not on file  Social Needs  . Financial resource strain: Not on file  . Food insecurity - worry: Not on file  . Food insecurity - inability: Not on file  . Transportation needs - medical: Not on file  . Transportation needs - non-medical: Not on file  Occupational History  . 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 Smoking status: Never Smoker  Substance and Sexual Activity  . Alcohol use: Yes  . Drug use: Not on file  . Sexual activity: Not on file  Other Topics Concern  . Not on file  Social History Narrative  . Not on file   The PMH, PSH, Social History, Family History, Medications, and allergies have been reviewed in [facility], and have been updated if relevant.    Review of Systems  Constitutional:  Negative.   HENT: Negative.   Eyes: Negative.   Respiratory: Negative.   Cardiovascular: Negative.   Gastrointestinal: Negative.   Endocrine: Negative.   Genitourinary: Negative.   Musculoskeletal: Negative.   Skin: Negative.   Neurological: Negative.   Hematological: Negative.   Psychiatric/Behavioral: Positive for sleep disturbance. Negative for behavioral problems, confusion, decreased concentration, dysphoric mood, hallucinations, self-injury and suicidal ideas. The patient is not nervous/anxious and is not hyperactive.   All other systems reviewed and are negative.      Objective:    BP 110/60 (BP Location: Left Arm, Patient Position: Sitting, Cuff Size: Normal)   Pulse (!) 50   Temp 99.5 F (37.5 C) (Oral)   Ht 5\' 4"  (1.626 m)   Wt 136 lb 3.2 oz (61.8 kg)   LMP 11/03/2012   SpO2 97%   BMI 23.38 kg/m    Physical Exam    General:  Well-developed,well-nourished,in no acute distress; alert,appropriate and cooperative throughout examination Head:  normocephalic and atraumatic.   Eyes:  vision grossly intact, PERRL Ears:  R ear normal and L ear normal externally, TMs clear bilaterally Nose:  no external deformity.   Mouth:  good dentition.   Neck:  No deformities, masses, or tenderness noted. Breasts:  No mass, nodules, thickening, tenderness, bulging, retraction, inflamation, nipple discharge or skin changes noted.   Lungs:  Normal respiratory effort, chest expands symmetrically. Lungs are clear to auscultation, no crackles or wheezes. Heart:  Normal rate and regular rhythm. S1 and S2 normal without gallop, murmur, click, rub or other extra sounds. Abdomen:  Bowel sounds positive,abdomen soft and non-tender without masses, organomegaly or hernias noted. Rectal:  no external abnormalities.   Genitalia:  Pelvic Exam:        External: normal female genitalia without lesions or masses        Vagina: normal without lesions or masses        Cervix: normal without lesions  or masses        Adnexa: normal bimanual exam without masses or fullness        Uterus: normal by palpation        Pap smear: performed Msk:  No deformity or scoliosis noted of thoracic or lumbar spine.   Extremities:  No clubbing, cyanosis, edema, or deformity noted with normal full range of motion of all joints.   Neurologic:  alert & oriented X3 and gait normal.   Skin:  Intact without suspicious lesions or rashes Cervical Nodes:  No  lymphadenopathy noted Axillary Nodes:  No palpable lymphadenopathy Psych:  Cognition and judgment appear intact. Alert and cooperative with normal attention span and concentration. No apparent delusions, illusions, hallucinations        Assessment & Plan:   Well woman exam with routine gynecological exam No Follow-up on file.

## 2017-11-03 NOTE — Assessment & Plan Note (Signed)
Well controlled on current rx. Refill sent.

## 2017-11-04 LAB — HIV ANTIBODY (ROUTINE TESTING W REFLEX): HIV 1&2 Ab, 4th Generation: NONREACTIVE

## 2017-11-05 LAB — CYTOLOGY - PAP
Bacterial vaginitis: NEGATIVE
CANDIDA VAGINITIS: NEGATIVE
DIAGNOSIS: NEGATIVE
HPV (WINDOPATH): NOT DETECTED

## 2017-12-29 ENCOUNTER — Other Ambulatory Visit: Payer: Self-pay | Admitting: Family Medicine

## 2018-01-07 ENCOUNTER — Other Ambulatory Visit: Payer: Self-pay | Admitting: Family Medicine

## 2018-01-07 NOTE — Telephone Encounter (Signed)
That is [facility] and pt should be on generic Seasonique/thx dmf

## 2021-04-29 ENCOUNTER — Encounter: Payer: Self-pay | Admitting: Gastroenterology
